# Patient Record
Sex: Female | Born: 1955 | Race: White
Health system: Southern US, Community
[De-identification: ages and names within clinical notes are randomized; demographics above are authoritative.]

## PROBLEM LIST (undated history)

## (undated) DIAGNOSIS — E785 Hyperlipidemia, unspecified: Secondary | ICD-10-CM

## (undated) DIAGNOSIS — F419 Anxiety disorder, unspecified: Secondary | ICD-10-CM

## (undated) DIAGNOSIS — J45909 Unspecified asthma, uncomplicated: Secondary | ICD-10-CM

## (undated) DIAGNOSIS — E039 Hypothyroidism, unspecified: Secondary | ICD-10-CM

## (undated) DIAGNOSIS — K219 Gastro-esophageal reflux disease without esophagitis: Secondary | ICD-10-CM

## (undated) DIAGNOSIS — I10 Essential (primary) hypertension: Secondary | ICD-10-CM

## (undated) HISTORY — PX: CHOLECYSTECTOMY: SHX55

## (undated) HISTORY — PX: DIAGNOSTIC LAPAROSCOPY: SUR761

## (undated) HISTORY — DX: Essential (primary) hypertension: I10

## (undated) HISTORY — DX: Hyperlipidemia, unspecified: E78.5

## (undated) HISTORY — PX: CARDIAC CATHETERIZATION: SHX172

---

## 1997-08-15 ENCOUNTER — Other Ambulatory Visit: Admission: RE | Admit: 1997-08-15 | Discharge: 1997-08-15 | Payer: Self-pay | Admitting: Obstetrics and Gynecology

## 1998-02-01 HISTORY — PX: BREAST SURGERY: SHX581

## 1999-02-04 ENCOUNTER — Encounter: Payer: Self-pay | Admitting: Obstetrics and Gynecology

## 1999-02-04 ENCOUNTER — Encounter: Payer: Self-pay | Admitting: Surgery

## 1999-02-04 ENCOUNTER — Emergency Department (HOSPITAL_COMMUNITY): Admission: EM | Admit: 1999-02-04 | Discharge: 1999-02-05 | Payer: Self-pay | Admitting: Emergency Medicine

## 1999-02-04 ENCOUNTER — Ambulatory Visit (HOSPITAL_COMMUNITY): Admission: RE | Admit: 1999-02-04 | Discharge: 1999-02-04 | Payer: Self-pay | Admitting: Obstetrics and Gynecology

## 1999-11-25 ENCOUNTER — Other Ambulatory Visit: Admission: RE | Admit: 1999-11-25 | Discharge: 1999-11-25 | Payer: Self-pay | Admitting: Obstetrics and Gynecology

## 2002-06-21 ENCOUNTER — Other Ambulatory Visit: Admission: RE | Admit: 2002-06-21 | Discharge: 2002-06-21 | Payer: Self-pay | Admitting: Family Medicine

## 2002-08-01 ENCOUNTER — Ambulatory Visit (HOSPITAL_COMMUNITY): Admission: RE | Admit: 2002-08-01 | Discharge: 2002-08-01 | Payer: Self-pay | Admitting: Internal Medicine

## 2002-08-01 ENCOUNTER — Encounter: Payer: Self-pay | Admitting: Internal Medicine

## 2002-09-13 ENCOUNTER — Ambulatory Visit (HOSPITAL_COMMUNITY): Admission: RE | Admit: 2002-09-13 | Discharge: 2002-09-13 | Payer: Self-pay | Admitting: Neurology

## 2002-09-13 ENCOUNTER — Encounter: Payer: Self-pay | Admitting: Neurology

## 2003-06-27 ENCOUNTER — Other Ambulatory Visit: Admission: RE | Admit: 2003-06-27 | Discharge: 2003-06-27 | Payer: Self-pay | Admitting: Obstetrics and Gynecology

## 2003-07-25 ENCOUNTER — Encounter: Admission: RE | Admit: 2003-07-25 | Discharge: 2003-07-25 | Payer: Self-pay | Admitting: Internal Medicine

## 2004-07-08 ENCOUNTER — Other Ambulatory Visit: Admission: RE | Admit: 2004-07-08 | Discharge: 2004-07-08 | Payer: Self-pay | Admitting: Obstetrics and Gynecology

## 2004-07-30 ENCOUNTER — Encounter: Admission: RE | Admit: 2004-07-30 | Discharge: 2004-07-30 | Payer: Self-pay | Admitting: Obstetrics and Gynecology

## 2005-01-07 ENCOUNTER — Encounter: Admission: RE | Admit: 2005-01-07 | Discharge: 2005-01-07 | Payer: Self-pay | Admitting: Obstetrics and Gynecology

## 2005-08-12 ENCOUNTER — Encounter: Admission: RE | Admit: 2005-08-12 | Discharge: 2005-08-12 | Payer: Self-pay | Admitting: Obstetrics and Gynecology

## 2005-11-15 ENCOUNTER — Encounter: Admission: RE | Admit: 2005-11-15 | Discharge: 2005-11-15 | Payer: Self-pay | Admitting: Neurology

## 2006-08-11 ENCOUNTER — Observation Stay (HOSPITAL_COMMUNITY): Admission: EM | Admit: 2006-08-11 | Discharge: 2006-08-12 | Payer: Self-pay | Admitting: Urology

## 2006-08-11 ENCOUNTER — Other Ambulatory Visit: Payer: Self-pay | Admitting: Urology

## 2006-12-06 ENCOUNTER — Encounter: Admission: RE | Admit: 2006-12-06 | Discharge: 2006-12-06 | Payer: Self-pay | Admitting: Obstetrics and Gynecology

## 2007-01-05 ENCOUNTER — Other Ambulatory Visit: Admission: RE | Admit: 2007-01-05 | Discharge: 2007-01-05 | Payer: Self-pay | Admitting: Obstetrics & Gynecology

## 2007-11-28 ENCOUNTER — Ambulatory Visit: Payer: Self-pay | Admitting: Internal Medicine

## 2008-01-04 ENCOUNTER — Encounter: Admission: RE | Admit: 2008-01-04 | Discharge: 2008-01-04 | Payer: Self-pay | Admitting: Obstetrics & Gynecology

## 2008-01-08 ENCOUNTER — Ambulatory Visit: Payer: Self-pay | Admitting: Internal Medicine

## 2008-01-11 ENCOUNTER — Other Ambulatory Visit: Admission: RE | Admit: 2008-01-11 | Discharge: 2008-01-11 | Payer: Self-pay | Admitting: Obstetrics & Gynecology

## 2008-07-11 ENCOUNTER — Ambulatory Visit: Payer: Self-pay | Admitting: Internal Medicine

## 2008-12-03 ENCOUNTER — Ambulatory Visit: Payer: Self-pay | Admitting: Internal Medicine

## 2009-01-07 ENCOUNTER — Encounter: Admission: RE | Admit: 2009-01-07 | Discharge: 2009-01-07 | Payer: Self-pay | Admitting: Internal Medicine

## 2010-01-27 ENCOUNTER — Ambulatory Visit: Payer: Self-pay | Admitting: Internal Medicine

## 2010-02-03 ENCOUNTER — Encounter
Admission: RE | Admit: 2010-02-03 | Discharge: 2010-02-03 | Payer: Self-pay | Source: Home / Self Care | Attending: Internal Medicine | Admitting: Internal Medicine

## 2010-02-10 ENCOUNTER — Encounter
Admission: RE | Admit: 2010-02-10 | Discharge: 2010-02-10 | Payer: Self-pay | Source: Home / Self Care | Attending: Internal Medicine | Admitting: Internal Medicine

## 2010-03-03 ENCOUNTER — Ambulatory Visit
Admission: RE | Admit: 2010-03-03 | Discharge: 2010-03-03 | Payer: Self-pay | Source: Home / Self Care | Attending: Internal Medicine | Admitting: Internal Medicine

## 2010-03-04 ENCOUNTER — Telehealth (INDEPENDENT_AMBULATORY_CARE_PROVIDER_SITE_OTHER): Payer: Self-pay | Admitting: Radiology

## 2010-03-04 ENCOUNTER — Encounter: Payer: Self-pay | Admitting: Internal Medicine

## 2010-03-04 ENCOUNTER — Ambulatory Visit: Payer: Self-pay | Admitting: Internal Medicine

## 2010-03-04 ENCOUNTER — Ambulatory Visit (INDEPENDENT_AMBULATORY_CARE_PROVIDER_SITE_OTHER): Payer: 59 | Admitting: Internal Medicine

## 2010-03-04 DIAGNOSIS — R079 Chest pain, unspecified: Secondary | ICD-10-CM | POA: Insufficient documentation

## 2010-03-04 DIAGNOSIS — I10 Essential (primary) hypertension: Secondary | ICD-10-CM | POA: Insufficient documentation

## 2010-03-04 DIAGNOSIS — R0789 Other chest pain: Secondary | ICD-10-CM

## 2010-03-04 DIAGNOSIS — E785 Hyperlipidemia, unspecified: Secondary | ICD-10-CM | POA: Insufficient documentation

## 2010-03-05 ENCOUNTER — Encounter: Payer: Self-pay | Admitting: Cardiology

## 2010-03-05 ENCOUNTER — Ambulatory Visit (HOSPITAL_COMMUNITY): Payer: 59 | Attending: Cardiology

## 2010-03-05 DIAGNOSIS — R0789 Other chest pain: Secondary | ICD-10-CM

## 2010-03-05 DIAGNOSIS — R079 Chest pain, unspecified: Secondary | ICD-10-CM | POA: Insufficient documentation

## 2010-03-10 ENCOUNTER — Ambulatory Visit: Payer: Self-pay | Admitting: Cardiology

## 2010-03-10 ENCOUNTER — Encounter: Payer: Self-pay | Admitting: Internal Medicine

## 2010-03-10 ENCOUNTER — Encounter: Payer: Self-pay | Admitting: Cardiology

## 2010-03-10 DIAGNOSIS — R9430 Abnormal result of cardiovascular function study, unspecified: Secondary | ICD-10-CM | POA: Insufficient documentation

## 2010-03-11 NOTE — Assessment & Plan Note (Signed)
Summary: np6: chestpain.dr.baxley call spoke with lela.o.k.to double b...   Vital Signs:  Patient profile:   55 year old female Height:      63 inches Weight:      186 pounds BMI:     33.07 Pulse rate:   77 / minute BP sitting:   122 / 77  (left arm)  Vitals Entered By: Laurance Flatten CMA (March 04, 2010 11:28 AM)  History of Present Illness: Dr. Smith Mince is referred today by Dr. Lenord Fellers for evaluation of chest pressure.  The patient is a pleasant 55 yo woman with a family h/o CAD.  She has HTN and dyslipidemia and is on medical therapy. 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  Her past h/o is notable for pericarditis which was associated with pleuritic chest pain.  She has not had any symptoms of this for many yrs.  The patient has exercise induced asthma and has a difficult time with exertion.  She has been fatigued for the past few months.  3 weeks ago she was carrying out the garbage and developed chest pressure which resolved with rest.  She was awakened the past two nights with chest pressure which resolved spontaneously.  She did not seek medical attention initially.  The patient has chronic dyspnea. The pain radiates to the hands. No diaphoresis or worsening sob.  It is moderate in intensity.   Current Medications (verified): 1)  Pravastatin Sodium 40 Mg Tabs (Pravastatin Sodium) .... Take One Tablet By Mouth Daily At Bedtime 2)  Hyzaar 50-12.5 Mg Tabs (Losartan Potassium-Hctz) .... Once Daily 3)  Aspirin 81 Mg Tbec (Aspirin) .... Take One Tablet By Mouth Daily 4)  Furosemide 40 Mg Tabs (Furosemide) .... As Needed 5)  Advair Diskus 250-50 Mcg/dose Aepb (Fluticasone-Salmeterol) .... Two Times A Day 6)  Singulair 10 Mg Tabs (Montelukast Sodium) .... At Bedtime 7)  Flonase 50 Mcg/act Susp (Fluticasone Propionate) .... Uad 8)  Tavist Allergy 1.34 Mg Tabs (Clemastine Fumarate) .... Uad 9)  Synthroid  75 Mcg Tabs (Levothyroxine Sodium) .... Once Daily 10)  Nexium 40 Mg Cpdr (Esomeprazole Magnesium) .... Once Daily 11)  Topamax 100 Mg Tabs (Topiramate) .... At Bedtime 12)  Vitamin D 2000 Unit Tabs (Cholecalciferol) .... Once Daily 13)  Citracal/vitamin D 250-200 Mg-Unit Tabs (Calcium Citrate-Vitamin D) .... Once Daily 14)  Multivitamins   Tabs (Multiple Vitamin) .... Once Daily 15)  Pristiq 50 Mg Xr24h-Tab (Desvenlafaxine Succinate) .... 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  Past Medical History: HTN Obesity Asthma Dyslipidemia.    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  Review of Systems       All systems reviewed and negative except as noted in the HPI.  Physical Exam  General:  Obese, middle aged, well developed, well nourished, in no acute distress.  HEENT: normal Neck: supple. No JVD. Carotids 2+ bilaterally no bruits Cor: RRR no rubsor  gallops. A soft systolic murmur is present. Lungs: CTA Ab: soft, nontender. nondistended. No HSM. Good bowel sounds Ext: warm. no cyanosis, clubbing or edema Neuro: alert and oriented. Grossly nonfocal. affect pleasant    Impression & Recommendations:  Problem # 1:  CHEST PAIN (ICD-786.50) I  have reviewed the issues with the patient in detail.  She has chest pressure and multiple cardia risk factors and a normal ECG.  Because she cannot walk on the treadmill due to her asthma (her report), I have recommended a Lexiscan myoview. Will schedule this asap. If abnormal, then diagnositc catheterization would be recommended. Her updated medication list for this problem includes:    Aspirin 81 Mg Tbec (Aspirin) .Marland Kitchen... Take one tablet by mouth daily  Orders: Nuclear Stress Test (Nuc Stress  Test)  Problem # 2:  ESSENTIAL HYPERTENSION, BENIGN (ICD-401.1) Her blood pressure is controlled today. She will continue her meds as below. Her updated medication list for this problem includes:    Hyzaar 50-12.5 Mg Tabs (Losartan potassium-hctz) ..... Once daily    Aspirin 81 Mg Tbec (Aspirin) .Marland Kitchen... Take one tablet by mouth daily    Furosemide 40 Mg Tabs (Furosemide) .Marland Kitchen... As needed  Problem # 3:  DYSLIPIDEMIA (ICD-272.4) She will continue her current meds. Her updated medication list for this problem includes:    Pravastatin Sodium 40 Mg Tabs (Pravastatin sodium) .Marland Kitchen... Take one tablet by mouth daily at bedtime  Patient Instructions: 1)  Your physician has requested that you have an lexiscan myoview.  For further information please visit [email or web address]  Please follow instruction sheet, as given.   Orders Added: 1)  Nuclear Stress Test [Nuc Stress Test]

## 2010-03-11 NOTE — Progress Notes (Signed)
Summary: Nuc Pre-Procedure  Phone Note Other Incoming   Reason for Call: Confirm/change Appt Summary of Call: Pt given instructions while here for MD appt.     Nuclear Med Background Indications for Stress Test: Evaluation for Ischemia     Symptoms: Chest Pain, Chest Pressure, Chest Pressure with Exertion, SOB    Nuclear Pre-Procedure Cardiac Risk Factors: Family History - CAD, Hypertension, Lipids Height (in): 63

## 2010-03-11 NOTE — Assessment & Plan Note (Addendum)
Summary: Cardiology Nuclear Testing  Nuclear Med Background Indications for Stress Test: Evaluation for Ischemia    History Comments: No documented CAD  Symptoms: Chest Pain, Chest Pain with Exertion, Chest Pressure, Chest Pressure with Exertion, DOE, Fatigue, Palpitations, Rapid HR  Symptoms Comments: Last episode of JY:NWGNFAOZH   Nuclear Pre-Procedure Cardiac Risk Factors: Family History - CAD, Hypertension, Lipids Caffeine/Decaff Intake: None NPO After: 7:00 PM Lungs: Clear IV 0.9% NS with Angio Cath: 22g     IV Site: R Antecubital IV Started by: Bonnita Levan, RN Chest Size (in) 40     Cup Size C     Height (in): 63 Weight (lb): 187 BMI: 33.25  Nuclear Med Study 1 or 2 day study:  1 day     Stress Test Type:  Treadmill/Lexiscan Reading MD:  Olga Millers, MD     Referring MD:  Lewayne Bunting, MD Resting Radionuclide:  Technetium 10m Tetrofosmin     Resting Radionuclide Dose:  10.0 mCi  Stress Radionuclide:  Technetium 64m Tetrofosmin     Stress Radionuclide Dose:  33.0 mCi   Stress Protocol Exercise Time (min):  2:00 min     Max HR:  122 bpm     Predicted Max HR:  166 bpm  Max Systolic BP: 132 mm Hg     Percent Max HR:  73.49 %Rate Pressure Product:  08657  Lexiscan: 0.4 mg   Stress Test Technologist:  [employer]a [employer], CMA-N     Nuclear Technologist:  Domenic Polite, CNMT  Rest Procedure  Myocardial perfusion imaging was performed at rest 45 minutes following the intravenous administration of Technetium 66m Tetrofosmin.  Stress Procedure  The patient received IV Lexiscan 0.4 mg over 15-seconds with concurrent low level exercise and then Technetium 51m Tetrofosmin was injected at 30-seconds.  There were no significant changes with Lexiscan.  Quantitative spect images were obtained after a 45 minute delay.  QPS Raw Data Images:  Acquisition technically good; normal left ventricular size. Stress Images:  There is decreased uptake in the apex. Rest Images:  Normal  homogeneous uptake in all areas of the myocardium. Subtraction (SDS):  These findings are consistent with mild apical ischemia. Transient Ischemic Dilatation:  1.10  (Normal <1.22)  Lung/Heart Ratio:  0.26  (Normal <0.45)  Quantitative Gated Spect Images QGS EDV:  73 ml QGS ESV:  20 ml QGS EF:  73 % QGS cine images:  Normal wall motion.   Overall Impression  Exercise Capacity: Lexiscan with no exercise. BP Response: Normal blood pressure response. Clinical Symptoms: No chest pain ECG Impression: No significant ST segment change suggestive of ischemia. Overall Impression: Abnormal lexiscan nuclear study with mild apical ischemia.

## 2010-03-13 ENCOUNTER — Ambulatory Visit (HOSPITAL_COMMUNITY): Payer: 59

## 2010-03-13 ENCOUNTER — Ambulatory Visit (HOSPITAL_COMMUNITY)
Admission: RE | Admit: 2010-03-13 | Discharge: 2010-03-13 | Disposition: A | Discharge status: Home / Self Care | Payer: 59 | Source: Ambulatory Visit | Attending: Cardiology | Admitting: Cardiology

## 2010-03-13 DIAGNOSIS — R079 Chest pain, unspecified: Secondary | ICD-10-CM | POA: Insufficient documentation

## 2010-03-13 LAB — CBC
MCHC: 34.3 g/dL (ref 30.0–36.0)
MCV: 91.8 fL (ref 78.0–100.0)
Platelets: 261 10*3/uL (ref 150–400)
RBC: 4.51 MIL/uL (ref 3.87–5.11)
RDW: 14.2 % (ref 11.5–15.5)
WBC: 8 10*3/uL (ref 4.0–10.5)

## 2010-03-13 LAB — BASIC METABOLIC PANEL
CO2: 23 mEq/L (ref 19–32)
GFR calc non Af Amer: >60 mL/min (ref >60)
Potassium: 5.4 mEq/L — ABNORMAL HIGH (ref 3.5–5.1)
Sodium: 135 mEq/L (ref 135–145)

## 2010-03-19 NOTE — Miscellaneous (Signed)
Summary: Orders Update  Clinical Lists Changes  Problems: Added new problem of NONSPECIFIC ABNORMAL UNSPEC CV FUNCTION STUDY (ICD-794.30) Orders: Added new Referral order of Cardiac Catheterization (Cardiac Cath) - Signed

## 2010-03-19 NOTE — Letter (Signed)
Summary: Cardiac Catheterization Instructions- Main Lab  [facility], Main Office  [address]   [place], [place] [address]   Phone: [phone]  Fax: [phone]     03/10/2010 MRN: [id number]  [facility] [address] [place], [place]  [address]  Dear Ms. Cleland,   You are scheduled for Cardiac Catheterization on Friday March 13, 2010 with Dr. Riley Kill.  Please arrive at the [facility] at 8:30       a.m. on the day of your procedure.  1. DIET     _X___ Nothing to eat or drink after midnight except your medications with a sip of water.  2. MAKE SURE YOU TAKE YOUR ASPIRIN.  3. __X__ YOU MAY TAKE ALL of your remaining medications with a small amount of water.      4. Plan for one night stay - bring personal belongings (i.e. toothpaste, toothbrush, etc.)  5. Bring a current list of your medications and current insurance cards.  6. Must have a responsible person to drive you home.   7. Someone must be with you for the first 24 hours after you arrive home.  8. Please wear clothes that are easy to get on and off and wear slip-on shoes.  *Special note: Every effort is made to have your procedure done on time.  Occasionally there are emergencies that present themselves at the hospital that may cause delays.  Please be patient if a delay does occur.  If you have any questions after you get home, please call the office at the number listed above.  Julieta Gutting, RN, BSN

## 2010-04-06 ENCOUNTER — Encounter: Payer: Self-pay | Admitting: Cardiology

## 2010-04-07 ENCOUNTER — Encounter: Payer: Self-pay | Admitting: Cardiology

## 2010-04-07 ENCOUNTER — Ambulatory Visit (INDEPENDENT_AMBULATORY_CARE_PROVIDER_SITE_OTHER): Payer: 59 | Admitting: Cardiology

## 2010-04-07 DIAGNOSIS — R072 Precordial pain: Secondary | ICD-10-CM

## 2010-04-14 NOTE — Miscellaneous (Signed)
  Clinical Lists Changes  Observations: Added new observation of NUCLEAR NOS: Exercise Capacity: Lexiscan with no exercise. BP Response: Normal blood pressure response. Clinical Symptoms: No chest pain ECG Impression: No significant ST segment change suggestive of ischemia. Overall Impression: Abnormal lexiscan nuclear study with mild apical ischemia.  (03/05/2010 14:59)      Nuclear Study  Procedure date:  03/05/2010  Findings:      Exercise Capacity: Lexiscan with no exercise. BP Response: Normal blood pressure response. Clinical Symptoms: No chest pain ECG Impression: No significant ST segment change suggestive of ischemia. Overall Impression: Abnormal lexiscan nuclear study with mild apical ischemia.

## 2010-04-14 NOTE — Assessment & Plan Note (Signed)
Summary: cardiac eval/per pt needs appt to discuss where to go from he...   Visit Type:  Follow-up Primary Provider:  Sharlet Salina, MD  CC:  chest pain.  History of Present Illness: The patient returns for followup of chest discomfort. She recently had a cardiac catheterization which was normal. She had seen Dr. Ladona Ridgel for evaluation of chest discomfort. She thinks this is related to emotional stress. 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 She describes the discomfort as mid and under her left breast. She does notice it if she significantly exerts herself such as carrying 30 pounds. He has still been present since the catheterization. She thinks it might also be related to her history of reactive airway disease.  She does not think it is similar to her previous pericarditis.  Current Medications (verified): 1)  Pravastatin Sodium 40 Mg Tabs (Pravastatin Sodium) .... Take One Tablet By Mouth Daily At Bedtime 2)  Hyzaar 50-12.5 Mg Tabs (Losartan Potassium-Hctz) .... Once Daily 3)  Aspirin 81 Mg Tbec (Aspirin) .... Take One Tablet By Mouth Daily 4)  Furosemide 40 Mg Tabs (Furosemide) .... As Needed 5)  Advair Diskus 250-50 Mcg/dose Aepb (Fluticasone-Salmeterol) .... Two Times A Day 6)  Singulair 10 Mg Tabs (Montelukast Sodium) .... At Bedtime 7)  Flonase 50 Mcg/act Susp (Fluticasone Propionate) .... Uad 8)  Tavist Allergy 1.34 Mg Tabs (Clemastine Fumarate) .... Uad 9)  Synthroid 75 Mcg Tabs (Levothyroxine Sodium) .... Once Daily 10)  Nexium 40 Mg Cpdr (Esomeprazole Magnesium) .... Once Daily 11)  Topamax 100 Mg Tabs (Topiramate) .... At Bedtime 12)  Vitamin D 2000 Unit Tabs (Cholecalciferol) .... Once Daily 13)  Citracal/vitamin D 250-200 Mg-Unit Tabs (Calcium Citrate-Vitamin D) .... Once Daily 14)  Multivitamins   Tabs (Multiple Vitamin) .... Once Daily 15)  Pristiq 50 Mg Xr24h-Tab (Desvenlafaxine Succinate) ....  Once Daily  Allergies (verified): No Known Drug Allergies  Past History:  Past Medical History: HTN Asthma Dyslipidemia.    Review of Systems       As stated in the HPI and negative for all other systems.   Vital Signs:  Patient profile:   55 year old female Height:      63 inches Weight:      187.50 pounds Pulse rate:   72 / minute Pulse rhythm:   regular Resp:     18 per minute BP sitting:   120 / 80  (left arm) Cuff size:   large  Vitals Entered By: Lisabeth Devoid RN (April 07, 2010 3:38 PM)  Physical Exam  General:  Well developed, well nourished, in no acute distress. Head:  normocephalic and atraumatic Neck:  Neck supple, no JVD. No masses, thyromegaly or abnormal cervical nodes. Chest Wall:  no deformities or breast masses noted Lungs:  Clear bilaterally to auscultation and percussion. Heart:  Non-displaced PMI, chest non-tender; regular rate and rhythm, S1, S2 without murmurs, rubs or gallops. Carotid upstroke normal, no bruit. Normal abdominal aortic size, no bruits. Femorals normal pulses, no bruits. Pedals normal pulses. trace edema no varicosities. Abdomen:  Bowel sounds positive; abdomen soft and non-tender without masses, organomegaly, or hernias noted. No hepatosplenomegaly. Msk:  Back normal, normal gait. Muscle strength and tone normal. Extremities:  No clubbing or cyanosis. Neurologic:  Alert and oriented x 3. Skin:  Intact without lesions or rashes. Cervical Nodes:  no significant adenopathy Inguinal Nodes:  no significant adenopathy Psych:  Normal affect.  Impression & Recommendations:  Problem # 1:  CHEST PAIN (ICD-786.50) Her chest pain is atypical. There does not appear to be a cardiac etiology. I do not suspect pericarditis and did not think echocardiography is indicated. Therefore, no further cardiovascular testing is suggested.  Problem # 2:  ESSENTIAL HYPERTENSION, BENIGN (ICD-401.1) Her blood pressure was controlled on meds as listed and  she will continue with this.  Problem # 3:  DYSLIPIDEMIA (ICD-272.4) Her lipids are followed by Dr. Lenord Fellers.  She will continue on pravasatin.  Patient Instructions: 1)  Your physician recommends that you schedule a follow-up appointment as needed 2)  Your physician recommends that you continue on your current medications as directed. Please refer to the Current Medication list given to you today.

## 2010-05-14 NOTE — Procedures (Signed)
  NAMESILVERIA, BOTZ            ACCOUNT NO.:  [id number]  MEDICAL RECORD NO.:  [id number]           PATIENT TYPE:  O  LOCATION:  [facility]                         FACILITY:  [facility]  PHYSICIAN:  Arturo Morton. 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 She was seen in the office as an add-on, and set up for diagnostic heart cath.  PROCEDURE: 1. Left heart catheterization. 2. Selective coronary arteriography. 3. Selective left ventriculography.  DESCRIPTION OF PROCEDURE:  The procedure was performed from the femoral artery using 4-French catheters.  She tolerated the procedure well without complications and was taken to the holding area in satisfactory clinical condition.  HEMODYNAMIC DATA: 1. The central aortic pressure was 133/78, mean 100. 2. LV pressure 127/40. 3. There was no pullback or gradient across the aortic valve.  ANGIOGRAPHIC DATA: 1. The left main coronary artery is free of significant disease. 2. The LAD courses to the apex.  There is a major diagonal and a major     LAD branch.  The LAD and its branches are free of critical disease. 3. There is a small ramus intermedius and a moderate-sized circumflex     marginal system that trifurcates distally.  The circumflex and its     branches are free of critical disease. 4. The right coronary artery has an upward takeoff, supplies an acute     marginal posterior descending and three posterolateral branches,     all of which are relatively small in caliber and free of critical     disease.  CONCLUSION: 1. Well-preserved overall left ventricular systolic function without     wall motion abnormality. 2. No significant high-grade focal obstruction.  DISPOSITION:  The patient will follow up with Dr. Lenord Fellers and Dr. Antoine Poche.  Postcath  instructions have been given to the patient.     Arturo Morton. Riley Kill, MD, [facility]     TDS/MEDQ  D:  04/06/2010  T:  04/07/2010  Job:  [id number]  Electronically Signed by Shawnie Pons MD [facility] on 05/14/2010 05:35:13 AM

## 2010-06-16 NOTE — Op Note (Signed)
NAMEBREYANNA, Donna Reynolds            ACCOUNT NO.:  [id number]   MEDICAL RECORD NO.:  [id number]          PATIENT TYPE:  AMB   LOCATION:  [facility]                         FACILITY:  [facility]   PHYSICIAN:  Sigmund I. 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   OPERATION:  Pelvic floor repair, anterior vaginal vault repair using was  Xenform and Capio device, with [place] pubovaginal sling.   SURGEON:  Sigmund I. Patsi Sears, M.D.   ANESTHESIA:  General LMA.   PREPARATION:  After appropriate preanesthesia, the patient was brought  to the operating room and placed upon the operating table in the dorsal  supine position, where general LMA anesthesia was introduced.  She was  then replaced in the dorsal lithotomy position, where the pubis was  prepped with Betadine solution and draped in the usual fashion.   REVIEW OF HISTORY:  This 55 year old married female has history of  stress urinary incontinence, and difficulty voiding.  She underwent  examination which showed a cystocele, and urodynamics which showed that  the patient had a rather large cystocele.  She was felt to have possible  dyssynergia, and a weak detrusor contraction.  The EMG activity was  normal, however.  She was placed on alpha blockers, but failed this  medication, and continued to complain bitterly of stress urinary  incontinence.  She is now for pelvic floor repair.   PROCEDURE:  With the patient in the dorsal lithotomy position and  general anesthesia, the patient's cystocele became quite prominent, and  was a grade 3.  Plain 0.25% Xylocaine was injected in the midline of the  cystocele, and an 8-cm incision was made, subcutaneous tissue dissected  bilaterally back to the lateral surfaces of the pelvic floor.  Kelly  plication was accomplished using a 3-0 Vicryl suture.  Bleeding was  stopped with suture and electrocoagulation.   Following dissection to the ischial spines bilaterally, and using the  Capio device, 2 sutures on each side were then placed, proximal and  distalward in the ischial spines; and,  following this, a 6 x 8-cm  portion of Xenform was soaked and measured in trapezoid fashion and cut  with a 7-cm base and 4-cm opposite side.  This was then placed through  the previously-placed Capio sutures, with trimming accomplished  distalward.  The Xenform fit perfectly, and the patient had an excellent  cystocele repair and excellent pelvic floor repair.  The patient was  given indigo carmine, the wound was closed with running 2-0 Vicryl  suture.  Cystoscopy was accomplished, which showed blue contrast through  each ureteral orifice.   The bladder was drained of fluid again, and 0.25% plain Xylocaine was  then injected in the mid urethra.  A 1.5-cm incision was then made in  the midline and subcutaneous tissue dissected bilaterally to the level  of the pubovaginal cervical arch.  Two separate suprapubic  incisions  were then made; subcutaneous tissue was dissected.  Using the [place]  pubovaginal sling, both [place] needles were placed.  Repeat cystoscopy  revealed no evidence of any bladder trauma.   The sling was then placed in standard fashion, with great care taken to  avoid making the sling too tight.  With a right-angle clamp behind the  urethra and behind the urethral portion, the wings were cut  subcutaneously.  These wounds were closed with Dermabond.   The wound was then closed with running 2-0 Vicryl suture.  Packing with  Estrace cream was then applied, and the patient was awakened and taken  to the recovery room in good condition.      Sigmund I. Patsi Sears, M.D.  Electronically Signed     SIT/MEDQ  D:   08/11/2006  T:  08/12/2006  Job:  [id number]

## 2010-07-28 ENCOUNTER — Other Ambulatory Visit: Payer: 59 | Admitting: Internal Medicine

## 2010-07-28 ENCOUNTER — Ambulatory Visit (INDEPENDENT_AMBULATORY_CARE_PROVIDER_SITE_OTHER): Payer: 59 | Admitting: Internal Medicine

## 2010-07-28 DIAGNOSIS — IMO0001 Reserved for inherently not codable concepts without codable children: Secondary | ICD-10-CM

## 2010-07-28 DIAGNOSIS — E039 Hypothyroidism, unspecified: Secondary | ICD-10-CM

## 2010-07-29 ENCOUNTER — Encounter: Payer: Self-pay | Admitting: Internal Medicine

## 2010-07-29 NOTE — Progress Notes (Signed)
Pt not seen for office visit. Just had TSH drawn and asked for a total CK because of some generalized musculoskeletal pain. Both of these results were normal. Pt will return for PE in 6 months

## 2010-08-07 ENCOUNTER — Encounter: Payer: Self-pay | Admitting: Cardiology

## 2010-10-06 ENCOUNTER — Telehealth: Payer: Self-pay

## 2010-10-06 MED ORDER — TRAMADOL HCL 50 MG PO TABS: 50.0000 mg | ORAL_TABLET | Freq: Three times a day (TID) | ORAL | Status: AC | PRN

## 2010-10-06 NOTE — Telephone Encounter (Signed)
Patient calls today to request a rx for Tramodol for herself, for back pain. Has tried Tylenol and Ibuprofen without much relief. Rx written and faxed to [facility] for Tramodol 50 mg 1 po tid prn #90/ 2 refills

## 2010-10-06 NOTE — Telephone Encounter (Signed)
Patient calls today requesting a rx for

## 2010-11-17 LAB — I-STAT 8, (EC8 V) (CONVERTED LAB)
Bicarbonate: 22
Glucose, Bld: 88
HCT: 43
Hemoglobin: 14.6
TCO2: 23
pH, Ven: 7.306 — ABNORMAL HIGH

## 2010-12-07 ENCOUNTER — Other Ambulatory Visit: Payer: Self-pay

## 2010-12-07 MED ORDER — CETIRIZINE HCL 10 MG PO TABS: 10.0000 mg | ORAL_TABLET | Freq: Every day | ORAL | Status: DC

## 2011-02-11 ENCOUNTER — Other Ambulatory Visit: Payer: 59 | Admitting: Internal Medicine

## 2011-02-11 DIAGNOSIS — Z0000 Encounter for general adult medical examination without abnormal findings: Secondary | ICD-10-CM

## 2011-02-11 LAB — CBC WITH DIFFERENTIAL/PLATELET
Basophils Relative: 1 % (ref 0–1)
Eosinophils Absolute: 0.3 10*3/uL (ref 0.0–0.7)
Eosinophils Relative: 4 % (ref 0–5)
Hemoglobin: 13.8 g/dL (ref 12.0–15.0)
MCHC: 32.5 g/dL (ref 30.0–36.0)
MCV: 95.9 fL (ref 78.0–100.0)
Neutrophils Relative %: 56 % (ref 43–77)
Platelets: 275 10*3/uL (ref 150–400)
RBC: 4.43 MIL/uL (ref 3.87–5.11)
RDW: 14.4 % (ref 11.5–15.5)
WBC: 8.4 10*3/uL (ref 4.0–10.5)

## 2011-02-11 LAB — COMPREHENSIVE METABOLIC PANEL
AST: 21 U/L (ref 0–37)
BUN: 25 mg/dL — ABNORMAL HIGH (ref 6–23)
Creat: 0.75 mg/dL (ref 0.50–1.10)
Glucose, Bld: 87 mg/dL (ref 70–99)
Potassium: 3.7 mEq/L (ref 3.5–5.3)
Sodium: 138 mEq/L (ref 135–145)
Total Bilirubin: 0.6 mg/dL (ref 0.3–1.2)
Total Protein: 7.2 g/dL (ref 6.0–8.3)

## 2011-02-11 LAB — TSH: TSH: 0.892 u[IU]/mL (ref 0.350–4.500)

## 2011-02-11 LAB — FOLLICLE STIMULATING HORMONE: FSH: 58.7 m[IU]/mL

## 2011-02-11 LAB — LIPID PANEL: VLDL: 13 mg/dL (ref 0–40)

## 2011-02-15 ENCOUNTER — Ambulatory Visit (INDEPENDENT_AMBULATORY_CARE_PROVIDER_SITE_OTHER): Payer: 59 | Admitting: Internal Medicine

## 2011-02-15 ENCOUNTER — Encounter: Payer: Self-pay | Admitting: Internal Medicine

## 2011-02-15 VITALS — BP 120/78 | HR 76 | Ht 62.5 in | Wt 187.0 lb

## 2011-02-15 DIAGNOSIS — E039 Hypothyroidism, unspecified: Secondary | ICD-10-CM

## 2011-02-15 DIAGNOSIS — Z1211 Encounter for screening for malignant neoplasm of colon: Secondary | ICD-10-CM

## 2011-02-15 DIAGNOSIS — I10 Essential (primary) hypertension: Secondary | ICD-10-CM

## 2011-02-15 DIAGNOSIS — E785 Hyperlipidemia, unspecified: Secondary | ICD-10-CM

## 2011-02-15 DIAGNOSIS — K219 Gastro-esophageal reflux disease without esophagitis: Secondary | ICD-10-CM

## 2011-02-15 LAB — POCT URINALYSIS DIPSTICK
Bilirubin, UA: NEGATIVE
Glucose, UA: NEGATIVE
Leukocytes, UA: NEGATIVE
Nitrite, UA: NEGATIVE

## 2011-04-07 ENCOUNTER — Encounter (HOSPITAL_COMMUNITY): Payer: Self-pay | Admitting: Pharmacist

## 2011-04-08 ENCOUNTER — Encounter (HOSPITAL_COMMUNITY)
Admission: RE | Admit: 2011-04-08 | Discharge: 2011-04-08 | Disposition: A | Discharge status: Home / Self Care | Payer: 59 | Source: Ambulatory Visit | Attending: Obstetrics & Gynecology | Admitting: Obstetrics & Gynecology

## 2011-04-08 ENCOUNTER — Encounter (HOSPITAL_COMMUNITY): Payer: Self-pay

## 2011-04-08 HISTORY — DX: Hypothyroidism, unspecified: E03.9

## 2011-04-08 HISTORY — DX: Gastro-esophageal reflux disease without esophagitis: K21.9

## 2011-04-08 HISTORY — DX: Anxiety disorder, unspecified: F41.9

## 2011-04-08 LAB — BASIC METABOLIC PANEL
Calcium: 9.6 mg/dL (ref 8.4–10.5)
GFR calc Af Amer: >90 mL/min (ref >90)
GFR calc non Af Amer: 79 mL/min — ABNORMAL LOW (ref >90)
Potassium: 3.2 mEq/L — ABNORMAL LOW (ref 3.5–5.1)
Sodium: 135 mEq/L (ref 135–145)

## 2011-04-08 LAB — CBC
Hemoglobin: 13.7 g/dL (ref 12.0–15.0)
MCH: 30.6 pg (ref 26.0–34.0)
MCHC: 32.6 g/dL (ref 30.0–36.0)
Platelets: 278 10*3/uL (ref 150–400)
RDW: 14.2 % (ref 11.5–15.5)

## 2011-04-08 NOTE — Patient Instructions (Addendum)
YOUR PROCEDURE IS SCHEDULED ON:04/12/11  ENTER THROUGH THE MAIN ENTRANCE OF [facility] AT:0730 am  USE DESK PHONE AND DIAL [id number] TO INFORM us OF YOUR ARRIVAL  CALL [phone] IF YOU HAVE ANY QUESTIONS OR PROBLEMS PRIOR TO YOUR ARRIVAL.  REMEMBER: DO NOT EAT OR DRINK AFTER MIDNIGHT :Sunday  SPECIAL INSTRUCTIONS:   YOU MAY BRUSH YOUR TEETH THE MORNING OF SURGERY   TAKE THESE MEDICINES THE DAY OF SURGERY WITH SIP OF WATER: am meds   DO NOT WEAR JEWELRY, EYE MAKEUP, LIPSTICK OR DARK FINGERNAIL POLISH DO NOT WEAR LOTIONS DO NOT SHAVE FOR 48 HOURS PRIOR TO SURGERY  YOU WILL NOT BE ALLOWED TO DRIVE YOURSELF HOME.  NAME OF DRIVER: Fayrene Fearing- spouse

## 2011-04-11 MED ORDER — CEFAZOLIN SODIUM-DEXTROSE 2-3 GM-% IV SOLR
2.0000 g | INTRAVENOUS | Status: AC
  Administered 2011-04-12: 2 g via INTRAVENOUS
  Filled 2011-04-11: qty 50

## 2011-04-12 ENCOUNTER — Ambulatory Visit (HOSPITAL_COMMUNITY)
Admission: RE | Admit: 2011-04-12 | Discharge: 2011-04-12 | Disposition: A | Discharge status: Home Health | Payer: 59 | Source: Ambulatory Visit | Attending: Obstetrics & Gynecology | Admitting: Obstetrics & Gynecology

## 2011-04-12 ENCOUNTER — Encounter (HOSPITAL_COMMUNITY): Payer: Self-pay | Admitting: Anesthesiology

## 2011-04-12 ENCOUNTER — Encounter (HOSPITAL_COMMUNITY): Payer: Self-pay | Admitting: *Deleted

## 2011-04-12 ENCOUNTER — Encounter (HOSPITAL_COMMUNITY)
Admission: RE | Disposition: A | Discharge status: Home Health | Payer: Self-pay | Source: Ambulatory Visit | Attending: Obstetrics & Gynecology

## 2011-04-12 ENCOUNTER — Ambulatory Visit (HOSPITAL_COMMUNITY): Payer: 59 | Admitting: Anesthesiology

## 2011-04-12 DIAGNOSIS — K219 Gastro-esophageal reflux disease without esophagitis: Secondary | ICD-10-CM | POA: Insufficient documentation

## 2011-04-12 DIAGNOSIS — I10 Essential (primary) hypertension: Secondary | ICD-10-CM | POA: Insufficient documentation

## 2011-04-12 DIAGNOSIS — N8501 Benign endometrial hyperplasia: Secondary | ICD-10-CM | POA: Diagnosis present

## 2011-04-12 DIAGNOSIS — N950 Postmenopausal bleeding: Secondary | ICD-10-CM | POA: Diagnosis present

## 2011-04-12 HISTORY — PX: DILATION AND CURETTAGE OF UTERUS: SHX78

## 2011-04-12 SURGERY — DILATION AND CURETTAGE
Anesthesia: Monitor Anesthesia Care | Site: Vagina | Wound class: Clean Contaminated

## 2011-04-12 MED ORDER — LIDOCAINE-EPINEPHRINE 1 %-1:100000 IJ SOLN
INTRAMUSCULAR | Status: DC | PRN
  Administered 2011-04-12: 10 mL

## 2011-04-12 MED ORDER — FENTANYL CITRATE 0.05 MG/ML IJ SOLN
INTRAMUSCULAR | Status: AC
  Filled 2011-04-12: qty 2

## 2011-04-12 MED ORDER — PROPOFOL 10 MG/ML IV EMUL
INTRAVENOUS | Status: AC
  Filled 2011-04-12: qty 20

## 2011-04-12 MED ORDER — LACTATED RINGERS IV SOLN
INTRAVENOUS | Status: DC
  Administered 2011-04-12 (×2): via INTRAVENOUS

## 2011-04-12 MED ORDER — ONDANSETRON HCL 4 MG/2ML IJ SOLN
INTRAMUSCULAR | Status: DC | PRN
  Administered 2011-04-12: 4 mg via INTRAVENOUS

## 2011-04-12 MED ORDER — 0.9 % SODIUM CHLORIDE (POUR BTL) OPTIME
TOPICAL | Status: DC | PRN
  Administered 2011-04-12: 1000 mL

## 2011-04-12 MED ORDER — LIDOCAINE HCL (CARDIAC) 20 MG/ML IV SOLN
INTRAVENOUS | Status: DC | PRN
  Administered 2011-04-12: 20 mg via INTRAVENOUS

## 2011-04-12 MED ORDER — HYDROCODONE-ACETAMINOPHEN 5-500 MG PO TABS: 2.0000 | ORAL_TABLET | Freq: Four times a day (QID) | ORAL | Status: DC | PRN

## 2011-04-12 MED ORDER — FENTANYL CITRATE 0.05 MG/ML IJ SOLN: 25.0000 ug | INTRAMUSCULAR | Status: DC | PRN

## 2011-04-12 MED ORDER — ONDANSETRON HCL 4 MG/2ML IJ SOLN
INTRAMUSCULAR | Status: AC
  Filled 2011-04-12: qty 2

## 2011-04-12 MED ORDER — MEPERIDINE HCL 25 MG/ML IJ SOLN: 6.2500 mg | INTRAMUSCULAR | Status: DC | PRN

## 2011-04-12 MED ORDER — PROMETHAZINE HCL 25 MG/ML IJ SOLN: 6.2500 mg | INTRAMUSCULAR | Status: DC | PRN

## 2011-04-12 MED ORDER — PROPOFOL 10 MG/ML IV EMUL
INTRAVENOUS | Status: DC | PRN
  Administered 2011-04-12: 20 mg via INTRAVENOUS
  Administered 2011-04-12: 30 mg via INTRAVENOUS
  Administered 2011-04-12: 20 mg via INTRAVENOUS

## 2011-04-12 MED ORDER — KETOROLAC TROMETHAMINE 30 MG/ML IJ SOLN
INTRAMUSCULAR | Status: DC | PRN
  Administered 2011-04-12: 30 mg via INTRAVENOUS

## 2011-04-12 MED ORDER — LIDOCAINE HCL (CARDIAC) 20 MG/ML IV SOLN
INTRAVENOUS | Status: AC
  Filled 2011-04-12: qty 5

## 2011-04-12 MED ORDER — MIDAZOLAM HCL 5 MG/5ML IJ SOLN
INTRAMUSCULAR | Status: DC | PRN
  Administered 2011-04-12: 2 mg via INTRAVENOUS

## 2011-04-12 MED ORDER — PROPOFOL 10 MG/ML IV EMUL
INTRAVENOUS | Status: DC | PRN
  Administered 2011-04-12: 25 ug/kg/min via INTRAVENOUS

## 2011-04-12 MED ORDER — DEXAMETHASONE SODIUM PHOSPHATE 10 MG/ML IJ SOLN
INTRAMUSCULAR | Status: AC
  Filled 2011-04-12: qty 1

## 2011-04-12 MED ORDER — FENTANYL CITRATE 0.05 MG/ML IJ SOLN
INTRAMUSCULAR | Status: DC | PRN
  Administered 2011-04-12 (×4): 50 ug via INTRAVENOUS

## 2011-04-12 MED ORDER — MIDAZOLAM HCL 2 MG/2ML IJ SOLN
INTRAMUSCULAR | Status: AC
  Filled 2011-04-12: qty 2

## 2011-04-12 MED ORDER — KETOROLAC TROMETHAMINE 30 MG/ML IJ SOLN
INTRAMUSCULAR | Status: AC
  Filled 2011-04-12: qty 1

## 2011-04-12 MED ORDER — KETOROLAC TROMETHAMINE 30 MG/ML IJ SOLN: 15.0000 mg | Freq: Once | INTRAMUSCULAR | Status: DC | PRN

## 2011-04-12 SURGICAL SUPPLY — 15 items
CATH ROBINSON RED A/P 16FR (CATHETERS) ×2 IMPLANT
CLOTH BEACON ORANGE TIMEOUT ST (SAFETY) ×2 IMPLANT
CONTAINER PREFILL 10% NBF 60ML (FORM) ×2 IMPLANT
GLOVE BIOGEL PI IND STRL 7.0 (GLOVE) ×2 IMPLANT
GLOVE BIOGEL PI INDICATOR 7.0 (GLOVE) ×2
GLOVE ECLIPSE 6.5 STRL STRAW (GLOVE) ×4 IMPLANT
GOWN PREVENTION PLUS LG XLONG (DISPOSABLE) ×4 IMPLANT
NDL SPNL 22GX3.5 QUINCKE BK (NEEDLE) ×1 IMPLANT
NEEDLE HYPO 22GX1.5 SAFETY (NEEDLE) ×2 IMPLANT
NEEDLE SPNL 22GX3.5 QUINCKE BK (NEEDLE) ×2 IMPLANT
PACK VAGINAL MINOR WOMEN LF (CUSTOM PROCEDURE TRAY) ×2 IMPLANT
PAD PREP 24X48 CUFFED NSTRL (MISCELLANEOUS) ×1 IMPLANT
SYR CONTROL 10ML LL (SYRINGE) ×2 IMPLANT
TOWEL OR 17X24 6PK STRL BLUE (TOWEL DISPOSABLE) ×4 IMPLANT
WATER STERILE IRR 1000ML POUR (IV SOLUTION) ×2 IMPLANT

## 2011-04-12 NOTE — Op Note (Signed)
04/12/2011  9:25 AM  PATIENT:  Donna Reynolds  56 y.o. female with postmenopausal bleeding and simple endometrial hyperplasia on office biopsy here for D&C.  PRE-OPERATIVE DIAGNOSIS:  PMB/simple hyperplasia  POST-OPERATIVE DIAGNOSIS:  PMB/simple hyperplasia  PROCEDURE:  Procedure(s): DILATATION AND CURETTAGE  SURGEON:  Bebe Moncure,M SUZANNE  ASSISTANTS: OR staff   ANESTHESIA:   IV sedation, Dr. Arby Barrette oversaw the case.  ESTIMATED BLOOD LOSS: * No blood loss amount entered *  BLOOD ADMINISTERED:none   FLUIDS: 1000ccLR  UOP: 50 cc drained with I&O cath  SPECIMEN:  Endometrial curetting  DISPOSITION OF SPECIMEN:  PATHOLOGY  FINDINGS: 2nd degree cystocele and 2nd degree uterine prolapse noted under anesthesia   DESCRIPTION OF OPERATION: Patient was taken to the operating room. She is placed in the supine position.  IV sedation for was initiated. Time out was performed.  Once the patient was comfortable,her legs are positioned in the low lithotomy position in Lyons stirrups. SCDs were on her lower extremities bilaterally and functioning properly.  Patient was then positioned in the high lithotomy position. Betadine prep was performed. The perineum inner thighs and vagina were prepped x3. A red rubber Foley catheter was used to drain the bladder of all urine.  Patient was draped in a normal standard fashion.   Attention was turned the vagina. A bivalve speculum was used to visualize the cervix. The anterior lip of the cervix was grasped with a single-tooth tenaculum.  A paracervical block with 1% lidocaine mixed one-to-one with epinephrine (1:100,000 units) was used. 10 cc total was used.  The uterus sounded to 7 cm. The cervix was then dilated up to #21 dilator. Using a #1 toothed curette, the endometrial cavity was curetted until rough gritty texture is noted in all quadrants. There was endometrial tissue that was obtained in the specimen. This did not appear excessive or abnormal on  gross inspection. At this point the [facility] was completed and the tenaculum was removed from the anterior lip of the cervix. There was minimal bleeding noted from the cervix. All instruments were removed from the vagina.  The Betadine prep was washed off the skin. The legs were positioned back in the low lithotomy position. Sponge, laps, instrument, and needle counts were correct x3. Patient tolerated the procedure very well and was awakened from anesthesia and taken to recovery in stable condition.  COUNTS:  YES  PLAN OF CARE: Transfer to PACU

## 2011-04-12 NOTE — Anesthesia Preprocedure Evaluation (Signed)
Anesthesia Evaluation  Patient identified by MRN, date of birth, ID band Patient awake    Reviewed: Allergy & Precautions, H&P , NPO status , Patient's Chart, lab work & pertinent test results  Airway Mallampati: II TM Distance: >3 FB Neck ROM: full    Dental No notable dental hx. (+) Teeth Intact   Pulmonary neg pulmonary ROS,    Pulmonary exam normal       Cardiovascular hypertension, Pt. on medications     Neuro/Psych PSYCHIATRIC DISORDERS Anxiety negative neurological ROS     GI/Hepatic Neg liver ROS, GERD-  Medicated and Controlled,  Endo/Other  Hypothyroidism   Renal/GU negative Renal ROS     Musculoskeletal negative musculoskeletal ROS (+)   Abdominal Normal abdominal exam  (+)   Peds negative pediatric ROS (+)  Hematology negative hematology ROS (+)   Anesthesia Other Findings   Reproductive/Obstetrics negative OB ROS                           Anesthesia Physical Anesthesia Plan  ASA: II  Anesthesia Plan: MAC   Post-op Pain Management:    Induction: Intravenous  Airway Management Planned:   Additional Equipment:   Intra-op Plan:   Post-operative Plan:   Informed Consent: I have reviewed the patients History and Physical, chart, labs and discussed the procedure including the risks, benefits and alternatives for the proposed anesthesia with the patient or authorized representative who has indicated his/her understanding and acceptance.     Plan Discussed with: CRNA and Surgeon  Anesthesia Plan Comments:         Anesthesia Quick Evaluation

## 2011-04-12 NOTE — Preoperative (Signed)
Beta Blockers   Reason not to administer Beta Blockers:Not Applicable 

## 2011-04-12 NOTE — Discharge Instructions (Addendum)
Post-surgical Instructions, Outpatient Surgery  You may expect to feel dizzy, weak, and drowsy for as long as 24 hours after receiving the medicine that made you sleep (anesthetic). For the first 24 hours after your surgery:    Do not drive a car, ride a bicycle, participate in physical activities, or take public transportation until you are done taking narcotic pain medicines or as directed by Dr. Hyacinth Meeker.   Do not drink alcohol or take tranquilizers.   Do not take medicine that has not been prescribed by your physicians.   Do not sign important papers or make important decisions while on narcotic pain medicines.   Have a responsible person with you.   PAIN MANAGEMENT  Motrin 800mg .  (This is the same as 4-200mg  over the counter tablets of Motrin or ibuprofen.)  You may take this every eight hours or as needed for cramping.    Vicodin 5/500mg .  For more severe pain, take one or two tablets every four to six hours as needed for pain control.  (Remember that narcotic pain medications increase your risk of constipation.  If this becomes a problem, you may take an over the counter stool softener like Colace 100mg  up to four times a day.)  DO'S AND DON'T'S  Do not take a tub bath for one week.  You may shower on the first day after your surgery  Do not do any heavy lifting for one to two weeks.  This increases the chance of bleeding.  Do move around as you feel able.  Stairs are fine.  You may begin to exercise again as you feel able.  Do not lift any weights for two weeks.  Do not put anything in the vagina for two weeks--no tampons, intercourse, or douching.    REGULAR MEDIATIONS/VITAMINS:  You may restart all of your regular medications as prescribed.  You may restart all of your vitamins as you normally take them.    PLEASE CALL OR SEEK MEDICAL CARE IF:  You have persistent nausea and vomiting.   You have trouble eating or drinking.   You have an oral temperature above 100.5.    You have constipation that is not helped by adjusting diet or increasing fluid intake. Pain medicines are a common cause of constipation.   You have heavy vaginal bleeding   DISCHARGE INSTRUCTIONS: D&C / D&E The following instructions have been prepared to help you care for yourself upon your return home.   Personal hygiene: Marland Kitchen Use sanitary pads for vaginal drainage, not tampons. . Shower the day after your procedure. . NO tub baths, pools or Jacuzzis for 2-3 weeks. . Wipe front to back after using the bathroom.  Activity and limitations: . Do NOT drive or operate any equipment for 24 hours. The effects of anesthesia are still present and drowsiness may result. . Do NOT rest in bed all day. . Walking is encouraged. . Walk up and down stairs slowly. . You may resume your normal activity in one to two days or as indicated by your physician.  Sexual activity: NO intercourse for at least 2 weeks after the procedure, or as indicated by your physician.  Diet: Eat a light meal as desired this evening. You may resume your usual diet tomorrow.  Return to work: You may resume your work activities in one to two days or as indicated by your doctor.  What to expect after your surgery: Expect to have vaginal bleeding/discharge for 2-3 days and spotting for up to  10 days. It is not unusual to have soreness for up to 1-2 weeks. You may have a slight burning sensation when you urinate for the first day. Mild cramps may continue for a couple of days. You may have a regular period in 2-6 weeks.  Call your doctor for any of the following: . Excessive vaginal bleeding, saturating and changing one pad every hour. . Inability to urinate 6 hours after discharge from hospital. . Pain not relieved by pain medication. . Fever of 100.4 F or greater. . Unusual vaginal discharge or odor.  Return to office ________________ Call for an appointment ___________________  Patient's signature:  ______________________  Nurse's signature ________________________  Post Anesthesia Care Unit [phone]

## 2011-04-12 NOTE — Anesthesia Postprocedure Evaluation (Signed)
Anesthesia Post Note  Patient: Donna Reynolds  Procedure(s) Performed: Procedure(s) (LRB): DILATATION AND CURETTAGE (N/A)  Anesthesia type: GA  Patient location: PACU  Post pain: Pain level controlled  Post assessment: Post-op Vital signs reviewed  Last Vitals:  Filed Vitals:   04/12/11 1035  BP:   Pulse: 68  Temp:   Resp: 16    Post vital signs: Reviewed  Level of consciousness: sedated  Complications: No apparent anesthesia complications

## 2011-04-12 NOTE — Transfer of Care (Signed)
Immediate Anesthesia Transfer of Care Note  Patient: Donna Reynolds  Procedure(s) Performed: Procedure(s) (LRB): DILATATION AND CURETTAGE (N/A)  Patient Location: PACU  Anesthesia Type: MAC  Level of Consciousness: awake, alert , oriented and patient cooperative  Airway & Oxygen Therapy: Patient Spontanous Breathing and Patient connected to nasal cannula oxygen  Post-op Assessment: Report given to PACU RN and Post -op Vital signs reviewed and stable  Post vital signs: Reviewed and stable  Complications: No apparent anesthesia complications

## 2011-04-12 NOTE — H&P (Signed)
Donna Reynolds is an 56 y.o. female with history of postmenopausal bleeding.  Her FSH was drawn by her PCP and was in the postmenopausal range.  She has some irregular bleeding before her annual exam in February.  I recommended proceeding the day of her exam with an endometrial biopsy.  This result showed simple endometrial hyperplasia but the pathology commented about the scant specimen.  D&C was recommended for complete diagnosis and to help with planning for further treatment.  Risks and benefits have been discussed with the patient.  Pertinent Gynecological History: Menses: post-menopausal Bleeding: post menopausal bleeding Contraception: none DES exposure: denies Blood transfusions: none Sexually transmitted diseases: no past history Previous GYN Procedures: none  Last mammogram: normal Date: 2012 Last pap: normal Date: 2/13 OB History: G4, P1   Menstrual History: Menarche age:43 No LMP recorded.    Past Medical History  Diagnosis Date  . HTN (hypertension)   . Asthma   . Dyslipidemia   . Hypothyroidism   . GERD (gastroesophageal reflux disease)   . Anxiety     Past Surgical History  Procedure Date  . Cardiac catheterization   . Cholecystectomy   . Breast surgery   . Diagnostic laparoscopy     Family History  Problem Relation Age of Onset  . Coronary artery disease Father     Social History:  reports that she has never smoked. She has never used smokeless tobacco. She reports that she drinks alcohol. She reports that she does not use illicit drugs.  Allergies: No Known Allergies  Prescriptions prior to admission  Medication Sig Dispense Refill  . acetaminophen (TYLENOL) 500 MG tablet Take 1,000 mg by mouth every 6 (six) hours as needed. For pain      . calcium carbonate (OS-CAL) 600 MG TABS Take 600 mg by mouth daily.      . cetirizine (ZYRTEC) 10 MG tablet Take 1 tablet (10 mg total) by mouth daily.  300 tablet  0  . Cholecalciferol (VITAMIN D) 2000 UNITS  tablet Take 2,000 Units by mouth daily.        . citalopram (CELEXA) 40 MG tablet Take 40 mg by mouth daily.      . Fluticasone-Salmeterol (ADVAIR DISKUS) 250-50 MCG/DOSE AEPB Inhale 1 puff into the lungs 2 (two) times daily.        Marland Kitchen ibuprofen (ADVIL,MOTRIN) 200 MG tablet Take 400 mg by mouth every 6 (six) hours as needed. For pain      . lansoprazole (PREVACID) 30 MG capsule Take 30 mg by mouth daily.      Marland Kitchen levothyroxine (SYNTHROID, LEVOTHROID) 75 MCG tablet Take 75 mcg by mouth daily.        Marland Kitchen losartan-hydrochlorothiazide (HYZAAR) 50-12.5 MG per tablet Take 1 tablet by mouth daily.        . montelukast (SINGULAIR) 10 MG tablet Take 10 mg by mouth at bedtime.        . pravastatin (PRAVACHOL) 40 MG tablet Take 40 mg by mouth at bedtime.        Marland Kitchen aspirin 81 MG EC tablet Take 81 mg by mouth daily. Stopped taking on 04/07/11 in preparation for surgery.      . furosemide (LASIX) 40 MG tablet Take 40 mg by mouth as needed. For edema         Review of Systems  Constitutional: Negative for fever and chills.  Eyes: Negative for blurred vision.  Respiratory: Negative for cough and hemoptysis.   Cardiovascular: Negative for chest pain and  palpitations.  Gastrointestinal: Negative for heartburn, nausea, vomiting and abdominal pain.  Genitourinary: Negative for dysuria.  Musculoskeletal: Negative for myalgias.  Skin: Negative for rash.  Neurological: Negative for dizziness and headaches.  Endo/Heme/Allergies: Does not bruise/bleed easily.  Psychiatric/Behavioral: Negative for depression.    Blood pressure 128/78, pulse 74, temperature 97.9 F (36.6 C), temperature source Oral, resp. rate 18, SpO2 100.00%. Physical Exam  Vitals reviewed. Constitutional: She is oriented to person, place, and time. She appears well-developed and well-nourished.  HENT:  Head: Normocephalic and atraumatic.  Neck: Normal range of motion.  Cardiovascular: Normal rate and regular rhythm.   Respiratory: Effort normal  and breath sounds normal.  Neurological: She is alert and oriented to person, place, and time.  Skin: Skin is warm and dry.  Psychiatric: She has a normal mood and affect.    No results found for this or any previous visit (from the past 24 hour(s)).  No results found.  Assessment/Plan: 56 year old MWF with simple endometrial hyperplasia on office biopsy her for [facility] for more definitive diagnosis.  Further treatment will be planned pending results.  All questions answered and patient is here ready to proceed.  Donna Reynolds,M SUZANNE 04/12/2011, 8:27 AM

## 2011-04-12 NOTE — Anesthesia Postprocedure Evaluation (Signed)
Anesthesia Post Note  Patient: Donna Reynolds  Procedure(s) Performed: Procedure(s) (LRB): DILATATION AND CURETTAGE (N/A)  Anesthesia type: MAC  Patient location: PACU  Post pain: Pain level controlled  Post assessment: Post-op Vital signs reviewed  Last Vitals:  Filed Vitals:   04/12/11 0738  BP: 128/78  Pulse: 74  Temp: 36.6 C  Resp: 18    Post vital signs: Reviewed  Level of consciousness: sedated  Complications: No apparent anesthesia complications

## 2011-04-13 ENCOUNTER — Encounter (HOSPITAL_COMMUNITY): Payer: Self-pay | Admitting: Obstetrics & Gynecology

## 2011-04-19 HISTORY — PX: INTRAUTERINE DEVICE INSERTION: SHX323

## 2011-04-20 ENCOUNTER — Encounter: Payer: Self-pay | Admitting: Internal Medicine

## 2011-04-22 ENCOUNTER — Encounter: Payer: Self-pay | Admitting: Internal Medicine

## 2011-04-30 ENCOUNTER — Encounter: Payer: Self-pay | Admitting: Internal Medicine

## 2011-04-30 DIAGNOSIS — J45909 Unspecified asthma, uncomplicated: Secondary | ICD-10-CM | POA: Insufficient documentation

## 2011-04-30 DIAGNOSIS — K219 Gastro-esophageal reflux disease without esophagitis: Secondary | ICD-10-CM | POA: Insufficient documentation

## 2011-04-30 DIAGNOSIS — E039 Hypothyroidism, unspecified: Secondary | ICD-10-CM | POA: Insufficient documentation

## 2011-04-30 DIAGNOSIS — J309 Allergic rhinitis, unspecified: Secondary | ICD-10-CM | POA: Insufficient documentation

## 2011-04-30 NOTE — Progress Notes (Signed)
Subjective:    Patient ID: Donna Reynolds, female    DOB: [birth year], 56 y.o.   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 History of allergic rhinitis , asthma, GE reflux, hyperlipidemia, hypertension, hypothyroidism.patient was allergy tested by Dr.  Callas 2010 and was found to have positive skin test to house dust, dust mites, cockroach, mold mix, slight reaction to grass. Not positive to trees dog cat or feathers. Selective food testing was negative. Spirometry revealed a normal study. In 2011 patient had some sciatica in her right leg for which tramadol was prescribed. Occasionally has some dependent edema and takes Lasix 40 mg on a when necessary basis. GYN physician is Dr. Leda Quail. Patient had cardiology workup March 2012 for chest pain which was unremarkable. Had endoscopy and colonoscopy by Dr. Loreta Ave 2009. There was an isolated diverticulum in the left colon otherwise normal colonoscopy. History of shingles 2006. History of left lower lobe pneumonia August 2006. Had Pneumovax 1998. History of diverticulitis 2001. Gets annual influenza immunization. Positive measles titer 1989. Laparoscopic cholecystectomy March 1996. Hospitalized with chickenpox in the 1980's. History of mononucleosis in the 1980's. Had endoscopy at the time of colonoscopy in 2009 because of melanotic stools. Had been taking a lot of ibuprofen for dental problems. Endoscopy showed some gastric polyps. History of migraine headaches. 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 Has been on Topamax in the past per Dr. Anne Hahn. 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 Has had benign breast biopsy in the past.  Social history: She is married to a Teacher, early years/pre. One adult daughter who is married and lives in [place]. [place] a native of [place] [address] and [employer] for medical school. 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 Nonsmoker.  Family history: Mother with history of hypertension and hyperlipidemia. Father is deceased. He had a history of congestive heart failure, chronic kidney disease, and noninvasive bladder cancer.    Review of Systems  Constitutional: Negative.   HENT: Positive for rhinorrhea.   Eyes: Negative.   Respiratory: Negative.   Cardiovascular: Negative.   Gastrointestinal:       History of GE reflux  Genitourinary: Negative for dysuria.  Musculoskeletal: Negative.   Neurological: Negative.   Hematological: Negative.   Psychiatric/Behavioral: Positive for dysphoric mood.       Objective:   Physical Exam  Vitals reviewed. Constitutional: She is oriented to person, place, and time. She appears well-developed and well-nourished. No distress.  HENT:  Head: Normocephalic and atraumatic.  Right Ear: External ear normal.  Left Ear: External ear normal.  Mouth/Throat: Oropharynx is clear and moist. No oropharyngeal exudate.  Eyes: Conjunctivae and EOM are normal. Pupils are equal, round, and reactive to light. Right eye exhibits no discharge. Left eye exhibits no discharge. No scleral icterus.  Neck: Neck supple. No JVD present. No thyromegaly present.  Cardiovascular: Normal rate, regular rhythm, normal heart sounds and intact distal pulses.   No murmur heard. Pulmonary/Chest: Effort normal and breath sounds normal. No respiratory distress. She has no wheezes. She has no rales.       Breasts normal female  Abdominal: Soft. Bowel sounds are normal. She exhibits no distension and no mass. There is no tenderness. There is no rebound and no guarding.  Genitourinary:       Deferred to GYN physician  Lymphadenopathy:    She has  no cervical adenopathy.  Neurological: She is alert and oriented to person, place, and time. She has normal reflexes. She displays normal reflexes. No cranial nerve deficit.  Coordination normal.  Skin: Skin is warm and dry. No rash noted. She is not diaphoretic.  Psychiatric: She has a normal mood and affect. Her behavior is normal. Judgment and thought content normal.          Assessment & Plan:  Hypertension  Hyperlipidemia  History of asthma  History of allergic rhinitis  History of GE reflux  History of hypothyroidism  History of fibrocystic breast disease  History of migraine headaches  Plan: Return in 6 months& continue same medications.

## 2011-04-30 NOTE — Patient Instructions (Signed)
Continue same medications. Return in 6 months 

## 2011-08-19 ENCOUNTER — Ambulatory Visit (INDEPENDENT_AMBULATORY_CARE_PROVIDER_SITE_OTHER): Payer: 59 | Admitting: Internal Medicine

## 2011-08-19 ENCOUNTER — Encounter: Payer: Self-pay | Admitting: Internal Medicine

## 2011-08-19 VITALS — BP 118/74 | HR 80 | Temp 97.8°F | Ht 62.5 in | Wt 195.5 lb

## 2011-08-19 DIAGNOSIS — I10 Essential (primary) hypertension: Secondary | ICD-10-CM

## 2011-08-19 DIAGNOSIS — E785 Hyperlipidemia, unspecified: Secondary | ICD-10-CM

## 2011-08-19 DIAGNOSIS — E039 Hypothyroidism, unspecified: Secondary | ICD-10-CM

## 2011-08-19 LAB — COMPREHENSIVE METABOLIC PANEL
AST: 14 U/L (ref 0–37)
Alkaline Phosphatase: 67 U/L (ref 39–117)
BUN: 17 mg/dL (ref 6–23)
Creat: 0.85 mg/dL (ref 0.50–1.10)
Total Bilirubin: 0.6 mg/dL (ref 0.3–1.2)

## 2011-08-19 LAB — LIPID PANEL
Cholesterol: 123 mg/dL (ref 0–200)
Total CHOL/HDL Ratio: 2.1 Ratio
Triglycerides: 51 mg/dL (ref <150)
VLDL: 10 mg/dL (ref 0–40)

## 2011-08-19 NOTE — Patient Instructions (Addendum)
Return in 6 months for PE. We will review labs and make further recommendations on your meds.

## 2011-08-19 NOTE — Progress Notes (Signed)
  Subjective:    Patient ID: Donna Reynolds, female    DOB: 1955/06/30, 56 y.o.   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 Will be moving back to former home mid August. Has had some difficulty finding full time work. May look at doing some volunteer work for [employer]	[place]. 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 Has gained some weight. Needs Advair for SOB at times. Samples given 250/50. She thinks maybe Synthroid needs to be adjusted.    Review of Systems     Objective:   Physical Exam Chest clear; Cor RRR; No thyromegaly; Ext without edema.        Assessment & Plan:  HTN Hypothyroidism Hyperlipidemia  Plan:  RTC in 6 months for PE. Fasting labs done today including TSH, C-met; lipid panel.  Samples of Advair and Pristiq given.

## 2011-10-06 ENCOUNTER — Encounter: Payer: Self-pay | Admitting: Internal Medicine

## 2011-10-06 ENCOUNTER — Other Ambulatory Visit: Payer: Self-pay | Admitting: Internal Medicine

## 2011-10-06 ENCOUNTER — Ambulatory Visit
Admission: RE | Admit: 2011-10-06 | Discharge: 2011-10-06 | Disposition: A | Discharge status: Home / Self Care | Payer: 59 | Source: Ambulatory Visit | Attending: Internal Medicine | Admitting: Internal Medicine

## 2011-10-06 ENCOUNTER — Ambulatory Visit (INDEPENDENT_AMBULATORY_CARE_PROVIDER_SITE_OTHER): Payer: 59 | Admitting: Internal Medicine

## 2011-10-06 VITALS — BP 120/82 | HR 80 | Temp 99.2°F | Resp 12

## 2011-10-06 DIAGNOSIS — B349 Viral infection, unspecified: Secondary | ICD-10-CM

## 2011-10-06 DIAGNOSIS — R509 Fever, unspecified: Secondary | ICD-10-CM

## 2011-10-06 DIAGNOSIS — B9789 Other viral agents as the cause of diseases classified elsewhere: Secondary | ICD-10-CM

## 2011-10-06 LAB — CBC WITH DIFFERENTIAL/PLATELET
Eosinophils Absolute: 0.3 10*3/uL (ref 0.0–0.7)
Eosinophils Relative: 3 % (ref 0–5)
Hemoglobin: 12.6 g/dL (ref 12.0–15.0)
Lymphs Abs: 2.1 10*3/uL (ref 0.7–4.0)
MCH: 29.6 pg (ref 26.0–34.0)
MCV: 90.4 fL (ref 78.0–100.0)
Monocytes Relative: 10 % (ref 3–12)
Neutrophils Relative %: 62 % (ref 43–77)
RBC: 4.25 MIL/uL (ref 3.87–5.11)

## 2011-10-06 NOTE — Patient Instructions (Addendum)
Take Vicodin sparing for myalgias. Call in am with progress report.

## 2011-10-06 NOTE — Progress Notes (Signed)
  Subjective:    Patient ID: Donna Reynolds, female    DOB: 26-Jun-1955, 56 y.o.   MRN: [id number]  HPI Onset last night of fever and myalgias. Has had pain in  back bilaterally which has persisted across mid-thoracic area.  Temperature has been a 102. Patient has felt nauseated but has not vomited. She also has a headache. No sore throat. No cough. Noticed some blood with urination recently but has history of intermittent vaginal spotting s/p  D and C. . Last week had some urinary urgency but no dysuria. No URI symptoms. No known tick exposure. Has been busy moving recently and has lifted a lot of heavy boxes. However says this back pain is different from any previous musculoskeletal back pain she has had. [place]. No other significant travel history.   Review of Systems     Objective:   Physical Exam Bilateral CVA tenderness more prominent on right than left. Skin is warm and dry, flushed without rashes.  Nodes none. Pharynx is clear. Has large tonsils but they are not inflamed. TMs are clear. Chest clear. Cardiac exam regular rate and rhythm. Genitalia: NFEG. Dark blood coming from cervical os. U/A contains large blood obtained by clean catch not cath specimen.        Assessment & Plan:  Fever  Myalgias  Probable viral syndrome CXR is negative. WBC 9,000. Urine culture sent. Plan:   Vicodin 5/500 #60 one every 6-8 hours prn pain with one refill. Call tomorrow with progress report. Consider RMSF titer.

## 2011-10-07 ENCOUNTER — Telehealth: Payer: Self-pay | Admitting: Internal Medicine

## 2011-10-07 LAB — POCT URINALYSIS DIPSTICK
Glucose, UA: NEGATIVE
Leukocytes, UA: NEGATIVE
Spec Grav, UA: 1.01
Urobilinogen, UA: NEGATIVE

## 2011-10-07 NOTE — Telephone Encounter (Signed)
Called to see how patient was doing after acute visit yesterday. Says temperature is down to 99 from 102. Still has myalgias, malaise and fatigue. Patient is no worse and is pleased that fever is down. She is not on any antibiotics. Continue to observe. She will call if symptoms worsen. Urine culture is pending.

## 2011-10-08 LAB — URINE CULTURE: Colony Count: 2000

## 2011-10-12 ENCOUNTER — Ambulatory Visit (INDEPENDENT_AMBULATORY_CARE_PROVIDER_SITE_OTHER): Payer: 59 | Admitting: Internal Medicine

## 2011-10-12 DIAGNOSIS — R509 Fever, unspecified: Secondary | ICD-10-CM

## 2011-10-12 LAB — COMPREHENSIVE METABOLIC PANEL
BUN: 20 mg/dL (ref 6–23)
CO2: 29 mEq/L (ref 19–32)
Calcium: 9.1 mg/dL (ref 8.4–10.5)
Creat: 0.9 mg/dL (ref 0.50–1.10)
Glucose, Bld: 87 mg/dL (ref 70–99)
Total Bilirubin: 0.6 mg/dL (ref 0.3–1.2)

## 2011-10-12 LAB — CBC WITH DIFFERENTIAL/PLATELET
Lymphocytes Relative: 32 % (ref 12–46)
Lymphs Abs: 2.7 10*3/uL (ref 0.7–4.0)
MCV: 90.4 fL (ref 78.0–100.0)
Neutro Abs: 5.1 10*3/uL (ref 1.7–7.7)
Neutrophils Relative %: 60 % (ref 43–77)
Platelets: 294 10*3/uL (ref 150–400)
RBC: 4.4 MIL/uL (ref 3.87–5.11)
WBC: 8.5 10*3/uL (ref 4.0–10.5)

## 2011-10-12 LAB — SEDIMENTATION RATE: Sed Rate: 39 mm/hr — ABNORMAL HIGH (ref 0–22)

## 2011-10-13 LAB — CMV IGM: CMV IgM: 0.01 (ref <0.90)

## 2011-10-13 LAB — ROCKY MTN SPOTTED FVR ABS PNL(IGG+IGM): RMSF IgM: 0.13 IV

## 2011-10-13 LAB — ANA: Anti Nuclear Antibody(ANA): NEGATIVE

## 2011-11-01 ENCOUNTER — Encounter: Payer: Self-pay | Admitting: Internal Medicine

## 2011-11-01 NOTE — Progress Notes (Signed)
  Subjective:    Patient ID: Donna Reynolds, female    DOB: [birth year], 56 y.o.   MRN: [id number]  HPI 56 year old female seen recently for fever, myalgias, malaise and fatigue. CBC was within normal limits. Urine culture was negative. Chest x-ray was normal. Patient was percent to have had viral syndrome. Still not feeling well. Continues with extreme malaise and fatigue and low-grade fever. Patient is concerned about possible tickborne illness, et Karie Soda. Appetite is decreased. Looks fatigued.    Review of Systems     Objective:   Physical Exam HEENT exam: TMs and pharynx are clear. Chest is clear. Cardiac exam regular rate and rhythm normal S1 and S2. Extremities without edema. No inflamed joints.        Assessment & Plan:  Prolonged febrile illness with malaise and fatigue  Plan: Additional lab work will be obtained today including CMV titer, sedimentation rate, Lyme titer, RMSF, CBC and comprehensive metabolic panel.  Addendum: Sedimentation rate is 39, other lab work is within normal limits or negative

## 2011-11-01 NOTE — Patient Instructions (Addendum)
Await results of lab work. Continue hydrocodone for myalgias.

## 2012-02-17 ENCOUNTER — Other Ambulatory Visit: Payer: 59 | Admitting: Internal Medicine

## 2012-02-18 ENCOUNTER — Encounter: Payer: 59 | Admitting: Internal Medicine

## 2012-03-13 ENCOUNTER — Encounter: Payer: Self-pay | Admitting: Obstetrics & Gynecology

## 2012-03-16 ENCOUNTER — Encounter: Payer: 59 | Admitting: Internal Medicine

## 2012-04-26 ENCOUNTER — Encounter: Payer: Self-pay | Admitting: Obstetrics & Gynecology

## 2012-04-26 ENCOUNTER — Ambulatory Visit (INDEPENDENT_AMBULATORY_CARE_PROVIDER_SITE_OTHER): Payer: No Typology Code available for payment source | Admitting: Obstetrics & Gynecology

## 2012-04-26 VITALS — BP 126/62 | Ht 62.5 in | Wt 194.0 lb

## 2012-04-26 DIAGNOSIS — Z01419 Encounter for gynecological examination (general) (routine) without abnormal findings: Secondary | ICD-10-CM

## 2012-04-26 NOTE — Progress Notes (Signed)
57 y.o. [id number] MarriedCaucasianF here for annual exam.  Reports no bleeding since August.  Doing really well.  Reports some urgency and if her bladder if really full, she will have a small amt of incontinence.  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    Patient's last menstrual period was 09/02/2011.          Sexually active: yes  The current method of family planning is IUD and post menopausal status.    Exercising: yes  walking 3-4 times weekly. Smoker:  no  Health Maintenance: Pap:  03/31/11 WNL/negative HR HPV  MMG:  04/20/11, 04/22/11 additional views-diag mmg scheduled 4/14 Colonoscopy:  2009 BMD:   2010 lowest T score -0.9 TDaP:  2008   reports that she has never smoked. She has never used smokeless tobacco. She reports that  drinks alcohol. She reports that she does not use illicit drugs.  Past Medical History  Diagnosis Date  . HTN (hypertension)   . Asthma   . Dyslipidemia   . Hypothyroidism   . GERD (gastroesophageal reflux disease)   . Anxiety     Past Surgical History  Procedure Laterality Date  . Cardiac catheterization    . Cholecystectomy    . Breast surgery    . Diagnostic laparoscopy    . Dilation and curettage of uterus  04/12/2011    Procedure: DILATATION AND CURETTAGE;  Surgeon: Jerene Bears, MD;  Location: [facility];  Service: Gynecology;  Laterality: N/A;    Current Outpatient Prescriptions  Medication Sig Dispense Refill  . acetaminophen (TYLENOL) 500 MG tablet Take 1,000 mg by mouth every 6 (six) hours as needed. For pain      . aspirin 81 MG EC tablet Take 81 mg by mouth daily. Stopped taking on 04/07/11 in preparation for surgery.      . calcium carbonate (OS-CAL) 600 MG TABS Take 600 mg by mouth daily.      . cholecalciferol (VITAMIN D) 1000 UNITS tablet Take 1,000 Units by mouth daily.      . Fluticasone-Salmeterol (ADVAIR DISKUS) 250-50 MCG/DOSE AEPB Inhale 1 puff into the lungs 2  (two) times daily.        Marland Kitchen ibuprofen (ADVIL,MOTRIN) 200 MG tablet Take 400 mg by mouth every 6 (six) hours as needed. For pain      . lansoprazole (PREVACID) 30 MG capsule Take 30 mg by mouth daily.      Marland Kitchen levothyroxine (SYNTHROID, LEVOTHROID) 88 MCG tablet Take 88 mcg by mouth daily.      Marland Kitchen losartan-hydrochlorothiazide (HYZAAR) 50-12.5 MG per tablet Take 1 tablet by mouth daily.        . Magnesium 400 MG CAPS Take by mouth.      . montelukast (SINGULAIR) 10 MG tablet Take 10 mg by mouth at bedtime.        . Multiple Vitamins-Minerals (MULTIVITAMIN PO) Take by mouth.      . pravastatin (PRAVACHOL) 40 MG tablet Take 40 mg by mouth at bedtime.        . cetirizine (ZYRTEC) 10 MG tablet Take 1 tablet (10 mg total) by mouth daily.  300 tablet  0   No current facility-administered medications for this visit.    Family History  Problem Relation Age of Onset  . Coronary artery disease Father     ROS:  Pertinent items are noted in HPI.  Otherwise, a comprehensive ROS was negative.  Exam:   BP  126/62  Ht 5' 2.5" (1.588 m)  Wt 194 lb (87.998 kg)  BMI 34.9 kg/m2  LMP 09/02/2011 Height:   Height: 5' 2.5" (158.8 cm)  Ht Readings from Last 3 Encounters:  04/26/12 5' 2.5" (1.588 m)  08/19/11 5' 2.5" (1.588 m)  04/08/11 5' 2.5" (1.588 m)    General appearance: alert, cooperative and appears stated age Head: Normocephalic, without obvious abnormality, atraumatic Neck: no adenopathy, supple, symmetrical, trachea midline and thyroid not enlarged, symmetric, no tenderness/mass/nodules Lungs: clear to auscultation bilaterally Breasts: Inspection negative, No nipple retraction or dimpling, No nipple discharge or bleeding, No axillary or supraclavicular adenopathy, Normal to palpation without dominant masses Heart: regular rate and rhythm Abdomen: soft, non-tender; bowel sounds normal; no masses,  no organomegaly Extremities: extremities normal, atraumatic, no cyanosis or edema Skin: Skin color,  texture, turgor normal. No rashes or lesions Lymph nodes: Cervical, supraclavicular, and axillary nodes normal. No abnormal inguinal nodes palpated Neurologic: Grossly normal   Pelvic: External genitalia:  no lesions              Urethra:  normal appearing urethra with no masses, tenderness or lesions              Bartholins and Skenes: normal                 Vagina: normal appearing vagina with normal color and discharge, no lesions              Cervix: no lesions              Pap taken: no Bimanual Exam:  Uterus:  normal size, contour, position, consistency, mobility, non-tender              Adnexa: no mass, fullness, tenderness               Rectovaginal: Confirms               Anus:  normal sphincter tone, no lesions  A:  Well Woman with normal exam, history of simple endometrial hyperplasia s/p Mirena IUD use.  P:   Labs with Dr. Lenord Fellers Continue IUD use.  No biopsy today. Mammogram return annually or prn  An After Visit Summary was printed and given to the patient.

## 2012-04-26 NOTE — Patient Instructions (Signed)

## 2012-05-02 ENCOUNTER — Encounter: Payer: 59 | Admitting: Internal Medicine

## 2012-05-09 ENCOUNTER — Telehealth: Payer: Self-pay | Admitting: Internal Medicine

## 2012-05-09 ENCOUNTER — Other Ambulatory Visit: Payer: Self-pay

## 2012-05-09 MED ORDER — MONTELUKAST SODIUM 10 MG PO TABS: 10.0000 mg | ORAL_TABLET | Freq: Every day | ORAL | Status: DC

## 2012-05-09 NOTE — Telephone Encounter (Signed)
Please refill for one year to new pharmacy

## 2012-06-19 ENCOUNTER — Telehealth: Payer: Self-pay | Admitting: Internal Medicine

## 2012-06-19 NOTE — Telephone Encounter (Signed)
Please refill these meds for one year.

## 2012-06-20 ENCOUNTER — Other Ambulatory Visit: Payer: Self-pay

## 2012-06-20 MED ORDER — CITALOPRAM HYDROBROMIDE 20 MG PO TABS: 20.0000 mg | ORAL_TABLET | Freq: Every day | ORAL | Status: DC

## 2012-06-20 MED ORDER — LOSARTAN POTASSIUM-HCTZ 50-12.5 MG PO TABS: 1.0000 | ORAL_TABLET | Freq: Every day | ORAL | Status: DC

## 2012-07-04 ENCOUNTER — Other Ambulatory Visit: Payer: No Typology Code available for payment source | Admitting: Internal Medicine

## 2012-07-06 ENCOUNTER — Encounter: Payer: No Typology Code available for payment source | Admitting: Internal Medicine

## 2012-08-07 ENCOUNTER — Other Ambulatory Visit: Payer: No Typology Code available for payment source | Admitting: Internal Medicine

## 2012-08-07 ENCOUNTER — Telehealth: Payer: Self-pay | Admitting: Internal Medicine

## 2012-08-07 MED ORDER — CITALOPRAM HYDROBROMIDE 20 MG PO TABS: 20.0000 mg | ORAL_TABLET | Freq: Every day | ORAL | Status: DC

## 2012-08-07 MED ORDER — PRAVASTATIN SODIUM 40 MG PO TABS: 40.0000 mg | ORAL_TABLET | Freq: Every day | ORAL | Status: DC

## 2012-08-08 ENCOUNTER — Other Ambulatory Visit: Payer: No Typology Code available for payment source | Admitting: Internal Medicine

## 2012-08-08 DIAGNOSIS — E039 Hypothyroidism, unspecified: Secondary | ICD-10-CM

## 2012-08-08 DIAGNOSIS — Z13220 Encounter for screening for lipoid disorders: Secondary | ICD-10-CM

## 2012-08-08 DIAGNOSIS — Z130 Encounter for screening for diseases of the blood and blood-forming organs and certain disorders involving the immune mechanism: Secondary | ICD-10-CM

## 2012-08-08 DIAGNOSIS — I10 Essential (primary) hypertension: Secondary | ICD-10-CM

## 2012-08-08 LAB — CBC WITH DIFFERENTIAL/PLATELET
Basophils Relative: 1 % (ref 0–1)
Eosinophils Absolute: 0.3 10*3/uL (ref 0.0–0.7)
Eosinophils Relative: 4 % (ref 0–5)
MCH: 29 pg (ref 26.0–34.0)
MCHC: 33.7 g/dL (ref 30.0–36.0)
MCV: 86.1 fL (ref 78.0–100.0)
Monocytes Relative: 8 % (ref 3–12)
Neutrophils Relative %: 55 % (ref 43–77)
Platelets: 286 10*3/uL (ref 150–400)

## 2012-08-08 LAB — LIPID PANEL
Cholesterol: 129 mg/dL (ref 0–200)
LDL Cholesterol: 62 mg/dL (ref 0–99)
Total CHOL/HDL Ratio: 2.2 Ratio
Triglycerides: 47 mg/dL (ref <150)
VLDL: 9 mg/dL (ref 0–40)

## 2012-08-08 LAB — COMPREHENSIVE METABOLIC PANEL
Alkaline Phosphatase: 83 U/L (ref 39–117)
CO2: 29 mEq/L (ref 19–32)
Creat: 0.9 mg/dL (ref 0.50–1.10)
Glucose, Bld: 97 mg/dL (ref 70–99)
Sodium: 137 mEq/L (ref 135–145)
Total Bilirubin: 0.7 mg/dL (ref 0.3–1.2)
Total Protein: 6.9 g/dL (ref 6.0–8.3)

## 2012-08-08 LAB — TSH: TSH: 0.968 u[IU]/mL (ref 0.350–4.500)

## 2012-08-14 ENCOUNTER — Encounter: Payer: Self-pay | Admitting: Internal Medicine

## 2012-08-14 ENCOUNTER — Ambulatory Visit (INDEPENDENT_AMBULATORY_CARE_PROVIDER_SITE_OTHER): Payer: No Typology Code available for payment source | Admitting: Internal Medicine

## 2012-08-14 VITALS — BP 110/84 | HR 80 | Temp 99.4°F | Ht 62.75 in | Wt 192.5 lb

## 2012-08-14 DIAGNOSIS — Z0000 Encounter for general adult medical examination without abnormal findings: Secondary | ICD-10-CM

## 2012-08-14 LAB — POCT URINALYSIS DIPSTICK
Leukocytes, UA: NEGATIVE
Nitrite, UA: NEGATIVE
Protein, UA: NEGATIVE
Urobilinogen, UA: NEGATIVE

## 2012-08-14 NOTE — Patient Instructions (Addendum)
Continue same meds  And return in 6 months

## 2012-09-08 ENCOUNTER — Other Ambulatory Visit: Payer: Self-pay

## 2012-09-08 MED ORDER — LEVOTHYROXINE SODIUM 75 MCG PO TABS: 75.0000 ug | ORAL_TABLET | Freq: Every day | ORAL | Status: DC

## 2012-10-06 ENCOUNTER — Other Ambulatory Visit: Payer: Self-pay

## 2012-10-06 MED ORDER — FLUTICASONE-SALMETEROL 250-50 MCG/DOSE IN AEPB: 1.0000 | INHALATION_SPRAY | Freq: Two times a day (BID) | RESPIRATORY_TRACT | Status: DC

## 2012-10-27 ENCOUNTER — Encounter: Payer: Self-pay | Admitting: Obstetrics & Gynecology

## 2012-11-24 ENCOUNTER — Telehealth: Payer: Self-pay | Admitting: Internal Medicine

## 2012-11-24 MED ORDER — LEVOFLOXACIN 500 MG PO TABS: 500.0000 mg | ORAL_TABLET | Freq: Every day | ORAL | Status: DC

## 2012-11-24 NOTE — Telephone Encounter (Signed)
Patient has come down with bronchitis. Will call in Levaquin 500 milligrams daily for 10 days to [facility] with one refill. She has inhalers on hand.

## 2013-01-04 ENCOUNTER — Other Ambulatory Visit: Payer: Self-pay | Admitting: *Deleted

## 2013-01-04 MED ORDER — PRAVASTATIN SODIUM 40 MG PO TABS: 40.0000 mg | ORAL_TABLET | Freq: Every day | ORAL | Status: DC

## 2013-02-01 ENCOUNTER — Telehealth: Payer: Self-pay | Admitting: Internal Medicine

## 2013-02-01 NOTE — Telephone Encounter (Signed)
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 Call in Tamiflu 75 mg daily for 10 days to [facility]

## 2013-02-01 NOTE — Progress Notes (Signed)
Subjective:    Patient ID: Donna Reynolds, female    DOB: February 03, 1955, 58 y.o.   MRN: [id number]  HPI 58 year old Female for health maintenance and evaluation of medical issues. History of essential hypertension, hyperlipidemia, allergic rhinitis, asthma, GE reflux, hypothyroidism. History of polycystic ovary syndrome.  She was allergy tested by Dr. Donneta Romberg in 2010 was found to have positive skin test to house dust, dust mites, cockroach, mold mix, slight reaction to grass. Not positive to trees, dog, cat or feathers. Selective food testing was negative. Is prominent tree was normal.  In 2011 she had some sciatica in her right leg for which tramadol as prescribed.  Occasionally has some dependent edema and takes Lasix 40 mg daily.  GYN physician is Dr. Edwinna Areola.  Has had benign breast biopsy in the past.  A cardiology workup March 2012 for chest pain which was unremarkable.  Had endoscopy and colonoscopy by Dr. Collene Mares 2009. There was an isolated diverticulum in her left colon otherwise normal colonoscopy. Had endoscopy at the time of colonoscopy because of melena. She been taking a lot of ibuprofen for dental problems. Endoscopy showed some gastric polyps.  History of shingles in 2006.  History of left lower lobe pneumonia August 2006. Had Pneumovax in 1998.  History of diverticulitis in 2001.  Gets annual influenza immunization.  Positive measles titer 1989.  Laparoscopic cholecystectomy March 1996.  History of mononucleosis in the 1980s.  Hospitalized for chickenpox in 1990  History of migraine headaches. 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 She's been on Topamax in the past per Dr. Jannifer Franklin. 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 Has one adult daughter who is married and now lives in [place].  Patient is a native of [place] and attending [employer] for medical school. Her parents immigrated from [place]. She is an only child. She is a nonsmoker. Social alcohol consumption. She did her Web designer at [employer].    Family history: Father died with complications of congestive heart failure, had history of chronic kidney disease, and had a history of bladder cancer. Mother with history of hypertension and hyperlipidemia still living.        Review of Systems  Constitutional: Negative.   HENT: Negative.   Eyes: Negative.   Respiratory:       History of asthma  Cardiovascular: Negative.   Gastrointestinal:       History of GE reflux  Endocrine:       History of hypothyroidism and anti-sella syndrome with pituitary adenoma  Allergic/Immunologic: Positive for environmental allergies.  Neurological:       History of migraine headaches  Hematological: Negative.   Psychiatric/Behavioral:       History of depression       Objective:   Physical Exam  Vitals reviewed. Constitutional: She is oriented to person, place, and time. She appears well-developed and well-nourished. No distress.  HENT:  Head: Normocephalic and atraumatic.  Left Ear: External ear normal.  Mouth/Throat: Oropharynx is clear and moist. No oropharyngeal exudate.  Eyes: Conjunctivae and EOM are normal. Pupils are equal, round, and reactive to light. Right eye exhibits no discharge. Left eye exhibits no discharge. No scleral icterus.  Neck: Neck supple. No JVD present. No thyromegaly present.  Cardiovascular: Normal rate, regular rhythm, normal heart sounds and intact distal pulses.  No murmur heard. Pulmonary/Chest: Effort normal and breath sounds normal. No respiratory distress. She has no wheezes. She has no rales. She exhibits no tenderness.  Breasts normal female  Abdominal: Bowel sounds are normal. She exhibits no distension and no mass. There is no rebound and no guarding.   Genitourinary:  Deferred to GYN physician  Musculoskeletal: Normal range of motion. She exhibits no edema.  Lymphadenopathy:    She has no cervical adenopathy.  Neurological: She is alert and oriented to person, place, and time. She has normal reflexes. No cranial nerve deficit. Coordination normal.  Skin: Skin is warm and dry. No rash noted. She is not diaphoretic. No erythema. No pallor.  Psychiatric: She has a normal mood and affect. Her behavior is normal. Judgment and thought content normal.          Assessment & Plan:  History of allergic rhinitis  History of asthma  History of GE reflux  History of depression treated with SSRI  Anxiety  Hypothyroidism  History of polycystic ovary syndrome and postmenopausal bleeding status post D&C 2013. She does have an IUD per GYN physician.  History of hyperlipidemia treated with statin medication  History of hypertension  Weight gain-5-1/2 pounds since January 2013  Obesity  Occasional urge urinary incontinence  Plan: Return in one year or as needed. Needs to have annual mammogram.

## 2013-02-05 ENCOUNTER — Other Ambulatory Visit: Payer: Self-pay

## 2013-02-05 ENCOUNTER — Other Ambulatory Visit: Payer: No Typology Code available for payment source | Admitting: Internal Medicine

## 2013-02-05 MED ORDER — CITALOPRAM HYDROBROMIDE 20 MG PO TABS: 20.0000 mg | ORAL_TABLET | Freq: Every day | ORAL | Status: DC

## 2013-02-12 ENCOUNTER — Ambulatory Visit: Payer: No Typology Code available for payment source | Admitting: Internal Medicine

## 2013-03-01 ENCOUNTER — Other Ambulatory Visit: Payer: No Typology Code available for payment source | Admitting: Internal Medicine

## 2013-03-01 DIAGNOSIS — Z79899 Other long term (current) drug therapy: Secondary | ICD-10-CM

## 2013-03-01 DIAGNOSIS — Z0184 Encounter for antibody response examination: Secondary | ICD-10-CM

## 2013-03-01 DIAGNOSIS — E785 Hyperlipidemia, unspecified: Secondary | ICD-10-CM

## 2013-03-01 DIAGNOSIS — E039 Hypothyroidism, unspecified: Secondary | ICD-10-CM

## 2013-03-01 LAB — LIPID PANEL
CHOL/HDL RATIO: 2.3 ratio
Cholesterol: 129 mg/dL (ref 0–200)
HDL: 57 mg/dL (ref >39)
LDL CALC: 60 mg/dL (ref 0–99)
TRIGLYCERIDES: 60 mg/dL (ref <150)
VLDL: 12 mg/dL (ref 0–40)

## 2013-03-01 LAB — HEPATIC FUNCTION PANEL
ALBUMIN: 4 g/dL (ref 3.5–5.2)
ALT: 9 U/L (ref 0–35)
AST: 13 U/L (ref 0–37)
Alkaline Phosphatase: 75 U/L (ref 39–117)
BILIRUBIN TOTAL: 0.5 mg/dL (ref 0.2–1.2)
Bilirubin, Direct: 0.1 mg/dL (ref 0.0–0.3)
Indirect Bilirubin: 0.4 mg/dL (ref 0.2–1.2)
TOTAL PROTEIN: 6.8 g/dL (ref 6.0–8.3)

## 2013-03-01 LAB — TSH: TSH: 1.541 u[IU]/mL (ref 0.350–4.500)

## 2013-03-01 LAB — HEPATITIS B SURFACE ANTIBODY,QUALITATIVE: Hep B S Ab: POSITIVE — AB

## 2013-03-16 ENCOUNTER — Encounter: Payer: Self-pay | Admitting: Internal Medicine

## 2013-03-16 ENCOUNTER — Ambulatory Visit (INDEPENDENT_AMBULATORY_CARE_PROVIDER_SITE_OTHER): Payer: BC Managed Care – PPO | Admitting: Internal Medicine

## 2013-03-16 VITALS — BP 106/82 | HR 80 | Temp 98.8°F | Wt 195.0 lb

## 2013-03-16 DIAGNOSIS — E785 Hyperlipidemia, unspecified: Secondary | ICD-10-CM

## 2013-03-16 DIAGNOSIS — K219 Gastro-esophageal reflux disease without esophagitis: Secondary | ICD-10-CM

## 2013-03-16 DIAGNOSIS — E039 Hypothyroidism, unspecified: Secondary | ICD-10-CM

## 2013-03-16 DIAGNOSIS — I10 Essential (primary) hypertension: Secondary | ICD-10-CM

## 2013-04-27 ENCOUNTER — Ambulatory Visit (INDEPENDENT_AMBULATORY_CARE_PROVIDER_SITE_OTHER): Payer: BC Managed Care – PPO | Admitting: Obstetrics & Gynecology

## 2013-04-27 ENCOUNTER — Encounter: Payer: Self-pay | Admitting: Obstetrics & Gynecology

## 2013-04-27 VITALS — BP 98/66 | HR 86 | Resp 20 | Ht 62.5 in | Wt 194.0 lb

## 2013-04-27 DIAGNOSIS — Z01419 Encounter for gynecological examination (general) (routine) without abnormal findings: Secondary | ICD-10-CM

## 2013-04-27 DIAGNOSIS — Z124 Encounter for screening for malignant neoplasm of cervix: Secondary | ICD-10-CM

## 2013-04-27 NOTE — Progress Notes (Signed)
58 y.o. [id number] MarriedCaucasianF here for annual exam.  Daughter has recently moved to [facility].  Really great for patient.  Daughter is doing Investment banker, corporate.    No vaginal bleeding.  Has IUD for hyperplasia.  Sees Dr. Renold Genta every six months.  Working now with Hospice.    No LMP recorded. Patient is not currently having periods (Reason: IUD).          Sexually active: yes  The current method of family planning is IUD.    Exercising: yes  Walk 3 -4 x week Smoker:  no  Health Maintenance: Pap:  04/2011 Neg. HR HPV: NEG History of abnormal Pap:  no MMG:  05/2012 BI-RADS 1: NEG Colonoscopy:  2008 - 10 years BMD:   01/2009 TDaP:  2008 Screening Labs: PCP, Hb today: PCP, Urine today: PCP   reports that she has never smoked. She has never used smokeless tobacco. She reports that she does not drink alcohol or use illicit drugs.  Past Medical History  Diagnosis Date  . HTN (hypertension)   . Asthma   . Dyslipidemia   . Hypothyroidism   . GERD (gastroesophageal reflux disease)   . Anxiety     Past Surgical History  Procedure Laterality Date  . Cardiac catheterization    . Cholecystectomy    . Breast surgery Left 2000    B[place]  . Diagnostic laparoscopy    . Dilation and curettage of uterus  04/12/2011    Procedure: DILATATION AND CURETTAGE;  Surgeon: Megan Salon, MD;  Location: [facility];  Service: Gynecology;  Laterality: N/A;    Current Outpatient Prescriptions  Medication Sig Dispense Refill  . acetaminophen (TYLENOL) 500 MG tablet Take 1,000 mg by mouth as needed. For pain       . aspirin 81 MG EC tablet Take 81 mg by mouth daily. Stopped taking on 04/07/11 in preparation for surgery.      . cholecalciferol (VITAMIN D) 1000 UNITS tablet Take 1,000 Units by mouth daily.      . citalopram (CELEXA) 20 MG tablet Take 10 mg by mouth daily.      . fexofenadine (ALLEGRA) 180 MG tablet Take 180 mg by mouth daily.      . Fluticasone-Salmeterol (ADVAIR DISKUS) 250-50 MCG/DOSE  AEPB Inhale 1 puff into the lungs 2 (two) times daily.  60 each  6  . ibuprofen (ADVIL,MOTRIN) 200 MG tablet Take 400 mg by mouth every 6 (six) hours as needed. For pain      . lansoprazole (PREVACID) 30 MG capsule Take 15 mg by mouth daily.       Marland Kitchen levonorgestrel (MIRENA) 20 MCG/24HR IUD 1 each by Intrauterine route once.      Marland Kitchen levothyroxine (SYNTHROID, LEVOTHROID) 75 MCG tablet Take 1 tablet (75 mcg total) by mouth daily.  90 tablet  3  . losartan-hydrochlorothiazide (HYZAAR) 50-12.5 MG per tablet Take 1 tablet by mouth daily.  30 tablet  6  . Multiple Vitamins-Minerals (MULTIVITAMIN PO) Take by mouth.      . pravastatin (PRAVACHOL) 40 MG tablet Take 1 tablet (40 mg total) by mouth at bedtime.  30 tablet  5  . Magnesium 400 MG CAPS Take by mouth.      . montelukast (SINGULAIR) 10 MG tablet Take 1 tablet (10 mg total) by mouth at bedtime.  30 tablet  11   No current facility-administered medications for this visit.    Family History  Problem Relation Age of Onset  . Coronary artery  disease Father   . COPD Father   . Cancer Father     Bladder  . Hyperlipidemia Mother   . Hypertension Mother     ROS:  Pertinent items are noted in HPI.  Otherwise, a comprehensive ROS was negative.  Exam:   BP 98/66  Pulse 86  Resp 20  Ht 5' 2.5" (1.588 m)  Wt 194 lb (87.998 kg)  BMI 34.90 kg/m2  Weight change: stable  Height: 5' 2.5" (158.8 cm)  Ht Readings from Last 3 Encounters:  04/27/13 5' 2.5" (1.588 m)  08/14/12 5' 2.75" (1.594 m)  04/26/12 5' 2.5" (1.588 m)    General appearance: alert, cooperative and appears stated age Head: Normocephalic, without obvious abnormality, atraumatic Neck: no adenopathy, supple, symmetrical, trachea midline and thyroid normal to inspection and palpation Lungs: clear to auscultation bilaterally Breasts: normal appearance, no masses or tenderness Heart: regular rate and rhythm Abdomen: soft, non-tender; bowel sounds normal; no masses,  no  organomegaly Extremities: extremities normal, atraumatic, no cyanosis or edema Skin: Skin color, texture, turgor normal. No rashes or lesions Lymph nodes: Cervical, supraclavicular, and axillary nodes normal. No abnormal inguinal nodes palpated Neurologic: Grossly normal   Pelvic: External genitalia:  no lesions              Urethra:  normal appearing urethra with no masses, tenderness or lesions              Bartholins and Skenes: normal                 Vagina: normal appearing vagina with normal color and discharge, no lesions              Cervix: no lesions              Pap taken: yes Bimanual Exam:  Uterus:  normal size, contour, position, consistency, mobility, non-tender              Adnexa: normal adnexa and no mass, fullness, tenderness               Rectovaginal: Confirms               Anus:  normal sphincter tone, no lesions  A:  Well Woman with normal exam Elevated lipids Hypertension Pelvic relaxation Hypothyroidism  H/O simple hyperplasia without atypic 2/13.  F/U bx 9/13 neg.  IUD placed 3/13.  Due out 3/18.  P:   Mammogram yearly pap smear today Labs with Dr. Renold Genta IUD for hyperplasia return annually or prn  An After Visit Summary was printed and given to the patient.

## 2013-04-30 LAB — IPS PAP SMEAR ONLY

## 2013-06-11 ENCOUNTER — Encounter: Payer: Self-pay | Admitting: Nurse Practitioner

## 2013-06-11 ENCOUNTER — Telehealth: Payer: Self-pay | Admitting: Obstetrics & Gynecology

## 2013-06-11 ENCOUNTER — Ambulatory Visit (INDEPENDENT_AMBULATORY_CARE_PROVIDER_SITE_OTHER): Payer: No Typology Code available for payment source | Admitting: Nurse Practitioner

## 2013-06-11 VITALS — BP 124/76 | HR 80 | Resp 18 | Wt 195.8 lb

## 2013-06-11 DIAGNOSIS — N950 Postmenopausal bleeding: Secondary | ICD-10-CM

## 2013-06-11 DIAGNOSIS — Z30431 Encounter for routine checking of intrauterine contraceptive device: Secondary | ICD-10-CM

## 2013-06-11 DIAGNOSIS — N949 Unspecified condition associated with female genital organs and menstrual cycle: Secondary | ICD-10-CM

## 2013-06-11 DIAGNOSIS — R102 Pelvic and perineal pain: Secondary | ICD-10-CM

## 2013-06-11 NOTE — Progress Notes (Signed)
Subjective:     Patient ID: Donna Reynolds, female   DOB: [birth year], 58 y.o.   MRN: [id number]  HPI  This 58 yo MW Fe has a history of endometrial hyperplasia.  She had a D&C in 04/2011 and IUD Mirena placement afterwards on  04/19/11.  Yesterday am started having lower abdominal cramping with light brown vaginal bleeding.  Mostly with wiping.  She started checking her IUD strings and they seem to be a little longer than usual.  She was concerned that the IUD may be coming out.  No urinary symptoms.  No other GI symptoms.  They have just returned from a trip to [place] and [place] where they have done a lot of hiking and walking.  Onset of symptoms were not initiated with SA.   Review of Systems  Constitutional: Negative for fever, chills and fatigue.  Respiratory: Negative.   Cardiovascular: Negative.   Gastrointestinal: Negative.  Negative for nausea, vomiting, abdominal pain, diarrhea, constipation, blood in stool, abdominal distention and anal bleeding.  Genitourinary: Positive for menstrual problem and pelvic pain. Negative for dysuria, urgency, frequency, hematuria, flank pain, decreased urine volume, vaginal discharge, genital sores, vaginal pain and dyspareunia.       As noted vaginal bleeding  Musculoskeletal: Negative.   Skin: Negative.   Neurological: Negative.   Psychiatric/Behavioral: Negative.        Objective:   Physical Exam  Constitutional: She is oriented to person, place, and time. She appears well-developed and well-nourished. No distress.  Abdominal: Soft. She exhibits no distension. There is no tenderness. There is no guarding.  Genitourinary:  Light pink to brown vaginal discharge.  IUD strings are visible and are about 2 " long.  Neurological: She is alert and oriented to person, place, and time.  Psychiatric: She has a normal mood and affect. Her behavior is normal. Judgment and thought content normal.       Assessment:     History of endometrial hyperplasia  - now PMB History of Mirena IUD with placement 04/2011 Lower abdominal cramps   Plan:     Discussed with Dr. Sabra Heck Plan PUS for Thursday and follow - if endo lining is thicker may need endo biopsy as well. patient is agreeable

## 2013-06-11 NOTE — Patient Instructions (Signed)
You are scheduled for Thursday - PUS

## 2013-06-11 NOTE — Telephone Encounter (Signed)
Patient said she is having some spotting (very light), and having some cramping( like menstrual cramps). Started yesterday. Has and IUD and said that the string feel different as well. Wants to talk to the nurse to make sure everything is  okay

## 2013-06-11 NOTE — Telephone Encounter (Signed)
Thank you. Encounter closed. 

## 2013-06-11 NOTE — Telephone Encounter (Signed)
Spoke with patient. States that yesterday she has some light brown spotting, with back pain and lower abdominal pain, which patient describes as lower abdominal cramps. She states "this is very unusual for me" she states she has not had any bleeding or cramping "in many years." States that cramps are "not bad" but that she notices them and again states that this is unusual for her.  Patient states she checked her IUD strings and they feel "much longer and harder."  Patient states she she is currently at work. Office visit scheduled with Milford Cage, FNP for IUD check.    cc Dr. Sabra Heck.

## 2013-06-14 ENCOUNTER — Ambulatory Visit (INDEPENDENT_AMBULATORY_CARE_PROVIDER_SITE_OTHER): Payer: No Typology Code available for payment source

## 2013-06-14 ENCOUNTER — Ambulatory Visit (INDEPENDENT_AMBULATORY_CARE_PROVIDER_SITE_OTHER): Payer: No Typology Code available for payment source | Admitting: Obstetrics & Gynecology

## 2013-06-14 DIAGNOSIS — N950 Postmenopausal bleeding: Secondary | ICD-10-CM

## 2013-06-14 DIAGNOSIS — N949 Unspecified condition associated with female genital organs and menstrual cycle: Secondary | ICD-10-CM

## 2013-06-14 DIAGNOSIS — Z30431 Encounter for routine checking of intrauterine contraceptive device: Secondary | ICD-10-CM

## 2013-06-14 DIAGNOSIS — R102 Pelvic and perineal pain: Secondary | ICD-10-CM

## 2013-06-14 NOTE — Progress Notes (Signed)
58 y.o. G56P1 Married White female here for a pelvic ultrasound due to episode of PMP bleeding.  Pt with hx of simple endometrial hyperplasia without atypia treated with D&C and then Mirena IUD placed 3/13.  Repeat biopsy 9/13 was negative.  Pt reports she did have some PMS symptoms before bleeding started.  This has resolved.  No LMP recorded. Patient is not currently having periods (Reason: IUD).  Sexually active:  yes  Contraception: menopause  FINDINGS: UTERUS: 7.7 x 4.6 x 3.4cm.  24mm calcification noted.  No masses.  IUD in correct location. EMS: 3.29mm ADNEXA:   Left ovary 2.8 x 1.4 x 1.5cm with 1.3 x 1.3 x 1.0cm simple appearing cyst   Right ovary 2.1 x 0.9 x 1.0cm CUL DE SAC: no free fluid  D/w pt results of u/s.  Images reviewed.  Has a new, simple appearing, left ovarian cyst.  This appears benign.  On prior u/s, this was not present.  Feel endometrial biopsy not necessary and that she is at very low risk for recurrent hyperplasia.  Pt in agreement with plan.  She is a physician.  Assessment:  PMP bleeding.  H/O simple endometrial hyperplasia without atypia.  Mirena IUD since 9/13. Plan: Pt knows to call with any future bleeding and I would repeat a biopsy then.  ~15 minutes spent with patient >50% of time was in face to face discussion of above.

## 2013-06-14 NOTE — Progress Notes (Signed)
Reviewed personally.  M. Suzanne Nikala Walsworth, MD.  

## 2013-06-27 ENCOUNTER — Encounter: Payer: Self-pay | Admitting: Obstetrics & Gynecology

## 2013-07-26 ENCOUNTER — Ambulatory Visit: Payer: No Typology Code available for payment source | Admitting: Obstetrics & Gynecology

## 2013-08-23 ENCOUNTER — Other Ambulatory Visit: Payer: Self-pay

## 2013-08-23 MED ORDER — LEVOTHYROXINE SODIUM 75 MCG PO TABS: 75.0000 ug | ORAL_TABLET | Freq: Every day | ORAL | Status: DC

## 2013-09-07 ENCOUNTER — Other Ambulatory Visit: Payer: No Typology Code available for payment source | Admitting: Internal Medicine

## 2013-09-09 NOTE — Patient Instructions (Signed)
Continue same medications and return in 6 months 

## 2013-09-09 NOTE — Progress Notes (Signed)
   Subjective:    Patient ID: Felton Clinton, female    DOB: [birth year], 58 y.o.   MRN: [id number]  HPI  In today for six-month followup of hypertension, hyperlipidemia, hypothyroidism, and GE reflux. No new complaints or problems. Feels well. Starting new job with Hospice.    Review of Systems     Objective:   Physical Exam Skin warm and dry. Nodes none. Chest clear to auscultation. Cardiac exam regular rate and rhythm. No thyromegaly. Extremities without edema.       Assessment & Plan:  Hypothyroidism-TSH stable on current regimen. Current dose is 0.075 mg daily  Hyperlipidemia-lipid panel liver functions are entirely within normal limits on statin medication/Pravachol  GE reflux-treat with PPI  Hypertension-stable with Hyzaar 50/12.5 daily  Plan: Return for physical examination in 6 months.

## 2013-09-10 ENCOUNTER — Other Ambulatory Visit: Payer: No Typology Code available for payment source | Admitting: Internal Medicine

## 2013-09-10 DIAGNOSIS — Z0000 Encounter for general adult medical examination without abnormal findings: Secondary | ICD-10-CM

## 2013-09-10 DIAGNOSIS — E785 Hyperlipidemia, unspecified: Secondary | ICD-10-CM

## 2013-09-10 DIAGNOSIS — E039 Hypothyroidism, unspecified: Secondary | ICD-10-CM

## 2013-09-10 LAB — LIPID PANEL
CHOLESTEROL: 135 mg/dL (ref 0–200)
HDL: 54 mg/dL (ref >39)
LDL Cholesterol: 59 mg/dL (ref 0–99)
TRIGLYCERIDES: 109 mg/dL (ref <150)
Total CHOL/HDL Ratio: 2.5 Ratio
VLDL: 22 mg/dL (ref 0–40)

## 2013-09-10 LAB — CBC WITH DIFFERENTIAL/PLATELET
BASOS ABS: 0.1 10*3/uL (ref 0.0–0.1)
Basophils Relative: 1 % (ref 0–1)
Eosinophils Absolute: 0.2 10*3/uL (ref 0.0–0.7)
Eosinophils Relative: 2 % (ref 0–5)
HCT: 41.5 % (ref 36.0–46.0)
HEMOGLOBIN: 14.2 g/dL (ref 12.0–15.0)
LYMPHS ABS: 2.6 10*3/uL (ref 0.7–4.0)
LYMPHS PCT: 28 % (ref 12–46)
MCH: 30.5 pg (ref 26.0–34.0)
MCHC: 34.2 g/dL (ref 30.0–36.0)
MCV: 89.1 fL (ref 78.0–100.0)
Monocytes Absolute: 1 10*3/uL (ref 0.1–1.0)
Monocytes Relative: 11 % (ref 3–12)
NEUTROS ABS: 5.3 10*3/uL (ref 1.7–7.7)
Neutrophils Relative %: 58 % (ref 43–77)
Platelets: 313 10*3/uL (ref 150–400)
RBC: 4.66 MIL/uL (ref 3.87–5.11)
RDW: 15.1 % (ref 11.5–15.5)
WBC: 9.2 10*3/uL (ref 4.0–10.5)

## 2013-09-10 LAB — COMPREHENSIVE METABOLIC PANEL
ALBUMIN: 4.2 g/dL (ref 3.5–5.2)
ALT: 16 U/L (ref 0–35)
AST: 16 U/L (ref 0–37)
Alkaline Phosphatase: 76 U/L (ref 39–117)
BUN: 16 mg/dL (ref 6–23)
CALCIUM: 9.6 mg/dL (ref 8.4–10.5)
CHLORIDE: 97 meq/L (ref 96–112)
CO2: 25 meq/L (ref 19–32)
Creat: 0.79 mg/dL (ref 0.50–1.10)
Glucose, Bld: 83 mg/dL (ref 70–99)
POTASSIUM: 3.8 meq/L (ref 3.5–5.3)
Sodium: 135 mEq/L (ref 135–145)
Total Bilirubin: 1 mg/dL (ref 0.2–1.2)
Total Protein: 7.1 g/dL (ref 6.0–8.3)

## 2013-09-10 LAB — TSH: TSH: 0.987 u[IU]/mL (ref 0.350–4.500)

## 2013-09-11 ENCOUNTER — Encounter: Payer: No Typology Code available for payment source | Admitting: Internal Medicine

## 2013-09-11 LAB — VITAMIN D 25 HYDROXY (VIT D DEFICIENCY, FRACTURES): Vit D, 25-Hydroxy: 44 ng/mL (ref 30–89)

## 2013-10-11 ENCOUNTER — Encounter: Payer: No Typology Code available for payment source | Admitting: Internal Medicine

## 2013-10-25 ENCOUNTER — Ambulatory Visit (INDEPENDENT_AMBULATORY_CARE_PROVIDER_SITE_OTHER): Payer: No Typology Code available for payment source | Admitting: Internal Medicine

## 2013-10-25 ENCOUNTER — Encounter: Payer: Self-pay | Admitting: Internal Medicine

## 2013-10-25 VITALS — BP 110/70 | HR 72 | Ht 62.0 in | Wt 189.0 lb

## 2013-10-25 DIAGNOSIS — K219 Gastro-esophageal reflux disease without esophagitis: Secondary | ICD-10-CM

## 2013-10-25 DIAGNOSIS — E039 Hypothyroidism, unspecified: Secondary | ICD-10-CM

## 2013-10-25 DIAGNOSIS — E236 Other disorders of pituitary gland: Secondary | ICD-10-CM

## 2013-10-25 DIAGNOSIS — I10 Essential (primary) hypertension: Secondary | ICD-10-CM

## 2013-10-25 DIAGNOSIS — J309 Allergic rhinitis, unspecified: Secondary | ICD-10-CM

## 2013-10-25 DIAGNOSIS — E785 Hyperlipidemia, unspecified: Secondary | ICD-10-CM

## 2013-10-25 DIAGNOSIS — Z8709 Personal history of other diseases of the respiratory system: Secondary | ICD-10-CM

## 2013-10-25 DIAGNOSIS — Z8669 Personal history of other diseases of the nervous system and sense organs: Secondary | ICD-10-CM

## 2013-10-25 MED ORDER — HYDROCODONE-ACETAMINOPHEN 5-325 MG PO TABS: 1.0000 | ORAL_TABLET | Freq: Four times a day (QID) | ORAL | Status: DC | PRN

## 2013-11-23 ENCOUNTER — Other Ambulatory Visit: Payer: Self-pay

## 2013-11-23 MED ORDER — PRAVASTATIN SODIUM 40 MG PO TABS: 40.0000 mg | ORAL_TABLET | Freq: Every day | ORAL | Status: DC

## 2013-12-03 ENCOUNTER — Encounter: Payer: Self-pay | Admitting: Internal Medicine

## 2013-12-28 NOTE — Progress Notes (Signed)
Subjective:    Patient ID: Donna Reynolds, female    DOB: [birth year], 58 y.o.   MRN: [id number]  HPI  58 year old Female in today for health maintenance exam. Dr. Edwinna Areola is GYN physician. Patient has a history of essential hypertension, hyperlipidemia, allergic rhinitis, asthma, GE reflux, hypothyroidism. History of polycystic ovary syndrome.  Patient was allergy tested by Dr. Donneta Romberg in 2010. Was found to have positive skin test to house dust, dust mites, cockroach, mold mix, slight reaction to grass. Was not positive to trees, dog, cat or feathers. Selected food testing was negative. Spirometry was normal.  In 2011, she had some sciatica in her right leg for which Tramadol was prescribed. Occasionally has some dependent edema and takes 40 mg of Lasix daily on a when necessary basis. History of benign breast biopsy. Patient had cardiology evaluation March 2012 for chest pain which was unremarkable.  She had endoscopy and colonoscopy by Dr. Ellsworth Lennox in 2009. There was an isolated diverticulum in her left colon otherwise normal colonoscopy. Endoscopy was done at the time of colonoscopy because of melena. She had taken ibuprofen for dental problems. Endoscopy showed some gastric polyps.  History of shingles in 2006. History of left lower pneumonia August 2006. Had Pneumovax in 1998. History of diverticulitis in 2001. Positive measles titer 1989. Laparoscopic cholecystectomy March 1996. History of mononucleosis in the 1980s. Hospitalized for chickenpox in 1990.  History of migraine headaches. 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 She has taken Topamax in the past per Dr. Jannifer Franklin. 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 Has one adult daughter who is married and now lives in [place]. Patient is a native  of [place] and attended [employer]. Her parents immigrated from [place]. She is an only child. Nonsmoker. Social alcohol consumption. 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  Family history: Father died with complications of congestive heart failure, had history of chronic kidney disease and bladder cancer. Mother with history of hypertension and hyperlipidemia still living.  Patient has history of postmenopausal bleeding status post D&C 2013. She does have an IUD per GYN physician.    Review of Systems  Gastrointestinal:       GERD  Endocrine:       History of polycystic ovary syndrome, history of empty sella syndrome with small pituitary adenoma  Allergic/Immunologic:       History of asthma and allergic rhinitis  Neurological:       History of migraine headaches       Objective:   Physical Exam  Constitutional: She appears well-developed and well-nourished. No distress.  HENT:  Head: Normocephalic and atraumatic.  Right Ear: External ear normal.  Left Ear: External ear normal.  Mouth/Throat: Oropharynx is clear and moist. No oropharyngeal exudate.  Eyes: Conjunctivae and EOM are normal. Pupils are equal, round, and reactive to light. Right eye exhibits no discharge. Left eye exhibits no discharge. No scleral icterus.  Neck: Neck supple. No JVD present. No thyromegaly present.  Cardiovascular: Normal rate, regular rhythm, normal heart sounds and intact distal pulses.   No murmur heard. Pulmonary/Chest: Effort normal and breath sounds normal. No respiratory distress. She has no wheezes. She has no rales.  Breasts normal female  Abdominal: Soft. Bowel sounds are normal.  She exhibits no distension and no mass. There is no tenderness. There is no rebound and no guarding.  Genitourinary:  Deferred to GYN  Musculoskeletal: She exhibits no edema.  Lymphadenopathy:    She has no cervical adenopathy.    Neurological: She is alert. She has normal reflexes. No cranial nerve deficit. Coordination normal.  Skin: Skin is warm and dry. No rash noted. She is not diaphoretic.  Psychiatric: She has a normal mood and affect. Her behavior is normal. Judgment and thought content normal.  Vitals reviewed.         Assessment & Plan:  Essential hypertension-stable  Hyperlipidemia-stable  History of allergic rhinitis  History of GE reflux  History of asthma  History of hypothyroidism  History of empty sella syndrome and small pituitary adenoma  History of migraine headaches  History of polycystic ovary syndrome  Plan: Return in one year or as needed. Continue same medications. Patient gets annual flu vaccine.

## 2013-12-28 NOTE — Patient Instructions (Signed)
Continue same medications and return in one year. 

## 2014-01-03 ENCOUNTER — Other Ambulatory Visit: Payer: Self-pay | Admitting: *Deleted

## 2014-01-03 MED ORDER — LOSARTAN POTASSIUM-HCTZ 50-12.5 MG PO TABS: 1.0000 | ORAL_TABLET | Freq: Every day | ORAL | Status: DC

## 2014-01-13 IMAGING — CR DG CHEST 2V
2 series · 2 of 2 positions shown · non-contrast
Comparison: 03/13/2010

CLINICAL DATA: Fever and headache.

CHEST - 2 VIEW

[view not recorded (1 of 2)]
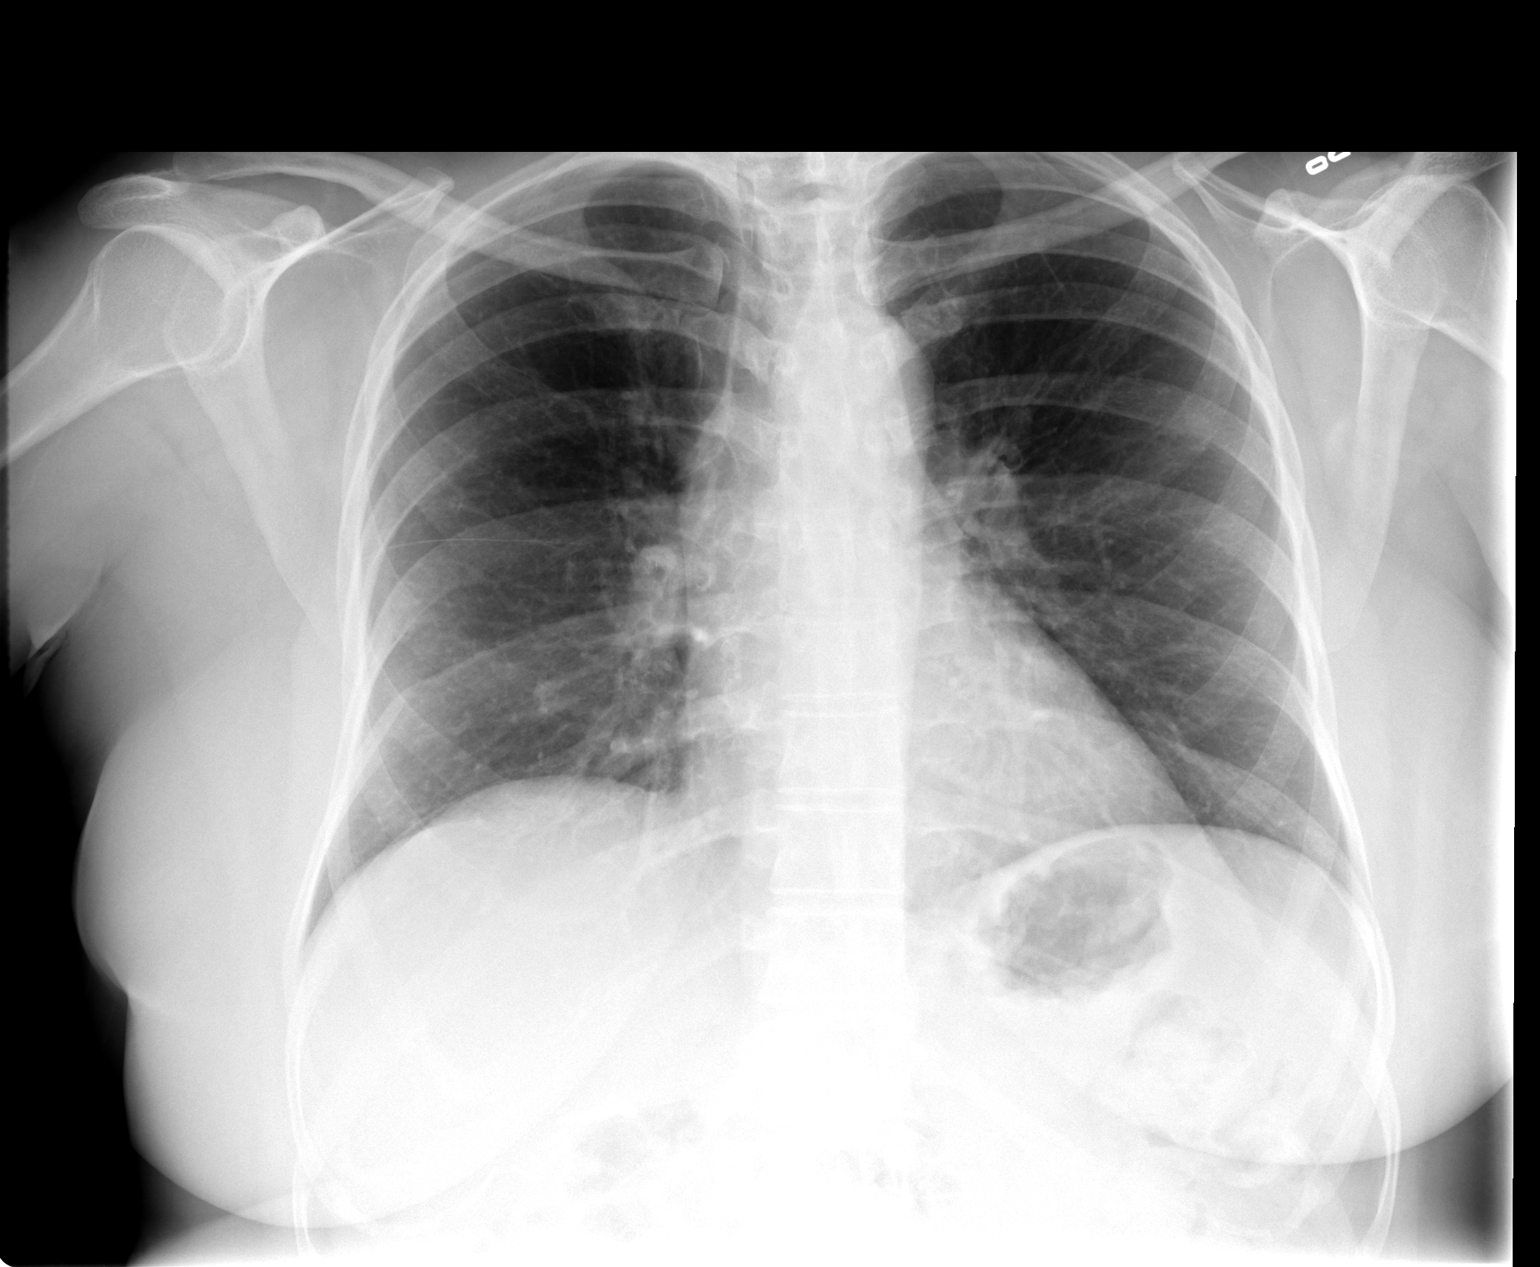

[view not recorded (2 of 2)]
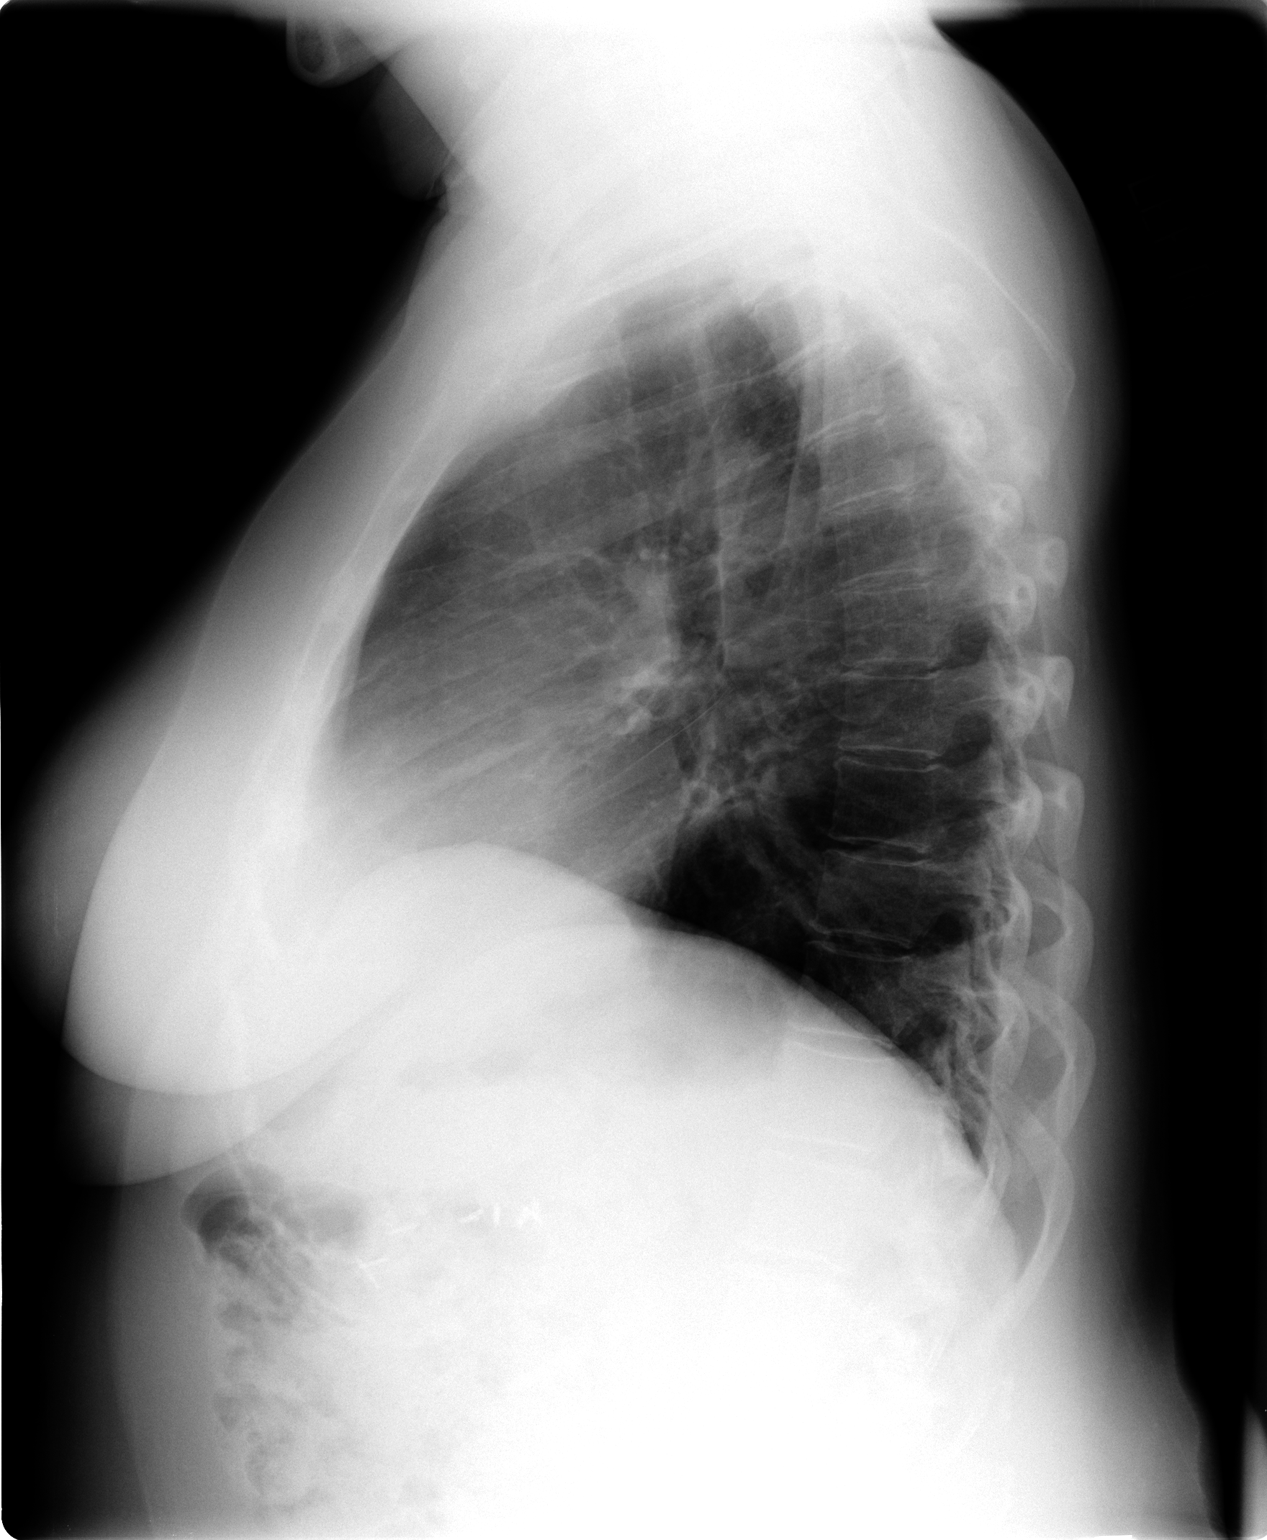

[2 of 2 positions shown; findings below may reference images not displayed]

FINDINGS: The lungs are clear without focal infiltrate, edema,
pneumothorax or pleural effusion. Cardiopericardial silhouette is
at upper limits of normal for size. Imaged bony structures of the
thorax are intact.
IMPRESSION: Stable.  No acute findings.

## 2014-03-08 ENCOUNTER — Other Ambulatory Visit: Payer: Self-pay | Admitting: *Deleted

## 2014-03-08 MED ORDER — CITALOPRAM HYDROBROMIDE 20 MG PO TABS
20.0000 mg | ORAL_TABLET | Freq: Every day | ORAL | Status: DC
Start: 2014-03-08 — End: 2014-04-09

## 2014-03-08 NOTE — Telephone Encounter (Signed)
Refill on Citalopram faxed to pharmacy by Dr Renold Genta

## 2014-04-09 ENCOUNTER — Other Ambulatory Visit: Payer: Self-pay | Admitting: *Deleted

## 2014-04-09 MED ORDER — CITALOPRAM HYDROBROMIDE 20 MG PO TABS: 20.0000 mg | ORAL_TABLET | Freq: Every day | ORAL | Status: DC

## 2014-04-09 NOTE — Telephone Encounter (Signed)
celexa refills sent to patient pharmacy

## 2014-04-23 ENCOUNTER — Other Ambulatory Visit: Payer: No Typology Code available for payment source | Admitting: Internal Medicine

## 2014-04-23 DIAGNOSIS — Z1329 Encounter for screening for other suspected endocrine disorder: Secondary | ICD-10-CM

## 2014-04-23 DIAGNOSIS — Z0000 Encounter for general adult medical examination without abnormal findings: Secondary | ICD-10-CM

## 2014-04-23 DIAGNOSIS — Z13220 Encounter for screening for lipoid disorders: Secondary | ICD-10-CM

## 2014-04-23 DIAGNOSIS — Z130 Encounter for screening for diseases of the blood and blood-forming organs and certain disorders involving the immune mechanism: Secondary | ICD-10-CM

## 2014-04-23 DIAGNOSIS — Z1321 Encounter for screening for nutritional disorder: Secondary | ICD-10-CM

## 2014-04-23 LAB — COMPLETE METABOLIC PANEL WITH GFR
ALBUMIN: 4.1 g/dL (ref 3.5–5.2)
ALK PHOS: 70 U/L (ref 39–117)
ALT: 13 U/L (ref 0–35)
AST: 16 U/L (ref 0–37)
BUN: 20 mg/dL (ref 6–23)
CO2: 30 mEq/L (ref 19–32)
Calcium: 9.5 mg/dL (ref 8.4–10.5)
Chloride: 103 mEq/L (ref 96–112)
Creat: 0.83 mg/dL (ref 0.50–1.10)
GFR, Est African American: >89 mL/min
GFR, Est Non African American: 78 mL/min
Glucose, Bld: 92 mg/dL (ref 70–99)
Potassium: 4.4 mEq/L (ref 3.5–5.3)
Sodium: 140 mEq/L (ref 135–145)
Total Bilirubin: 0.6 mg/dL (ref 0.2–1.2)
Total Protein: 6.8 g/dL (ref 6.0–8.3)

## 2014-04-23 LAB — CBC WITH DIFFERENTIAL/PLATELET
BASOS PCT: 1 % (ref 0–1)
Basophils Absolute: 0.1 10*3/uL (ref 0.0–0.1)
EOS ABS: 0.3 10*3/uL (ref 0.0–0.7)
Eosinophils Relative: 3 % (ref 0–5)
HEMATOCRIT: 42.6 % (ref 36.0–46.0)
Hemoglobin: 14 g/dL (ref 12.0–15.0)
LYMPHS ABS: 2.6 10*3/uL (ref 0.7–4.0)
Lymphocytes Relative: 31 % (ref 12–46)
MCH: 30.7 pg (ref 26.0–34.0)
MCHC: 32.9 g/dL (ref 30.0–36.0)
MCV: 93.4 fL (ref 78.0–100.0)
MONOS PCT: 9 % (ref 3–12)
MPV: 10.3 fL (ref 8.6–12.4)
Monocytes Absolute: 0.8 10*3/uL (ref 0.1–1.0)
NEUTROS ABS: 4.7 10*3/uL (ref 1.7–7.7)
Neutrophils Relative %: 56 % (ref 43–77)
Platelets: 282 10*3/uL (ref 150–400)
RBC: 4.56 MIL/uL (ref 3.87–5.11)
RDW: 14.2 % (ref 11.5–15.5)
WBC: 8.4 10*3/uL (ref 4.0–10.5)

## 2014-04-23 LAB — LIPID PANEL
CHOL/HDL RATIO: 2.2 ratio
Cholesterol: 116 mg/dL (ref 0–200)
HDL: 53 mg/dL (ref >=46)
LDL Cholesterol: 55 mg/dL (ref 0–99)
Triglycerides: 38 mg/dL (ref <150)
VLDL: 8 mg/dL (ref 0–40)

## 2014-04-24 LAB — TSH: TSH: 1.285 u[IU]/mL (ref 0.350–4.500)

## 2014-04-24 LAB — VITAMIN D 25 HYDROXY (VIT D DEFICIENCY, FRACTURES): VIT D 25 HYDROXY: 49 ng/mL (ref 30–100)

## 2014-04-25 ENCOUNTER — Ambulatory Visit (INDEPENDENT_AMBULATORY_CARE_PROVIDER_SITE_OTHER): Payer: No Typology Code available for payment source | Admitting: Internal Medicine

## 2014-04-25 ENCOUNTER — Encounter: Payer: Self-pay | Admitting: Internal Medicine

## 2014-04-25 VITALS — BP 98/68 | HR 72 | Temp 98.0°F | Ht 62.5 in | Wt 179.0 lb

## 2014-04-25 DIAGNOSIS — Z0000 Encounter for general adult medical examination without abnormal findings: Secondary | ICD-10-CM | POA: Diagnosis not present

## 2014-04-25 LAB — POCT URINALYSIS DIPSTICK
Bilirubin, UA: NEGATIVE
Blood, UA: NEGATIVE
Glucose, UA: NEGATIVE
Ketones, UA: NEGATIVE
Leukocytes, UA: NEGATIVE
Nitrite, UA: NEGATIVE
PH UA: 5
PROTEIN UA: NEGATIVE
Spec Grav, UA: <=1.005
UROBILINOGEN UA: NEGATIVE

## 2014-04-26 NOTE — Progress Notes (Signed)
Subjective:    Patient ID: Donna Reynolds, female    DOB: 1955-12-03, 59 y.o.   MRN: [id number]  HPI  59 year old Female in today for health maintenance exam and evaluation of medical issues. Dr. Edwinna Areola is GYN physician. Patient was seen by Dr. Sabra Heck May 2015 with an episode of postmenopausal bleeding. Had ultrasound showing new left ovarian cyst which appeared to be benign. History of endometrial hyperplasia without atypia treated March 2013 with D&C followed by Mirena IUD with repeat biopsy September 2013 negative. Still has IUD. No further episodes of bleeding.  Patient's blood pressures been running low recently. She has felt fatigued. Were going to stop antihypertensive medication and see if blood pressure improves and fatigue improves.  She has a history of essential hypertension, hyperlipidemia, allergic rhinitis, asthma, GE reflux, hypothyroidism.  She was allergy tested by Dr. Donneta Romberg and 22 and was found to have positive skin tests to house dust, dust mites, cockroach, mold mix, slight reaction to grass. Not positive to trees, dog, cat or feathers. Selective food testing was negative. Spirometry was normal.  In 2011, she had sciatica right leg for which tramadol was prescribed. Occasionally has dependent edema for which she takes Lasix when necessary. History of benign breast biopsy in the past.  Cardiology evaluation February 2012 for chest pain -negative cardiac catheterization.  Patient had an endoscopy and colonoscopy by Dr. Collene Mares 2009. There was an isolated diverticulum in her left colon otherwise normal. Had endoscopy at the time of colonoscopy because of melena. She had been taking a lot of ibuprofen for dental problems. Endoscopy showed some gastric polyps.  History of shingles in 2006.  History of left lower lobe pneumonia August 2006. Had Pneumovax in 1998.  History of diverticulitis in 2001.  Positive measles titer 1989.  Plavix Coppock cholecystectomy March  1996.  History of mononucleosis in the 1980s.  History of hospitalization for chickenpox.  History of migraine headaches. 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 Has tried Topamax in the past per Dr. Jannifer Franklin. 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  Social history: She is married to a Software engineer. One adult daughter who is married and now lives in [place]. Patient is a native of [place]. Attended [employer] Her parents immigrated from [place]. She is an only child. Nonsmoker. Social alcohol consumption. 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 Mother with history of hypertension and hyperlipidemia living.  Patient has lost 16 pounds in the past year on Weight Watchers.    Review of Systems  Constitutional: Positive for fatigue.  HENT: Negative.   Eyes: Negative.   Cardiovascular: Negative.   Gastrointestinal: Negative.   Genitourinary:       See note regarding evaluation by Dr. Sabra Heck May 2015  Hematological: Negative.   Psychiatric/Behavioral: Negative.        Objective:   Physical Exam  Constitutional: She is oriented to person, place, and time. She appears well-developed and well-nourished. No distress.  HENT:  Head: Normocephalic and atraumatic.  Right Ear: External ear normal.  Left Ear: External ear normal.  Mouth/Throat: Oropharynx is clear and moist. No oropharyngeal exudate.  Eyes: Conjunctivae are normal. Pupils are  equal, round, and reactive to light. Right eye exhibits no discharge. Left eye exhibits no discharge. No scleral icterus.  Neck: Neck supple. No JVD present. No thyromegaly present.    Cardiovascular: Normal rate, regular rhythm and normal heart sounds.   No murmur heard. Pulmonary/Chest: Effort normal and breath sounds normal. She has no wheezes. She has no rales.  Breasts normal female without masses  Abdominal: Soft. Bowel sounds are normal. She exhibits no distension and no mass. There is no tenderness. There is no rebound and no guarding.  Genitourinary:  Deferred to Dr. Edwinna Areola  Musculoskeletal: Normal range of motion. She exhibits no edema.  Lymphadenopathy:    She has no cervical adenopathy.  Neurological: She is alert and oriented to person, place, and time. She has normal reflexes. No cranial nerve deficit. Coordination normal.  Skin: Skin is warm and dry. No rash noted. She is not diaphoretic.  Psychiatric: She has a normal mood and affect. Her behavior is normal. Judgment and thought content normal.  Vitals reviewed.         Assessment & Plan:  Normal health maintenance exam  Hypotension-discontinue antihypertensive medication. Patient will monitor blood pressure and let me know if it is not well controlled off medication. Has been on Hyzaar 50/12.5  mg daily  Hyperlipidemia-normal on Pravachol 40 mg daily  History of asthma-has inhalers on hand  History of allergic rhinitis  Hypothyroidism-TSH stable on current dose of Synthroid  GE reflux-stable with Prevacid 30 mg daily  History of postmenopausal bleeding evaluated by GYN in May 2015 with new left ovarian cyst on ultrasound. Has Mirena IUD.

## 2014-04-26 NOTE — Patient Instructions (Signed)
Discontinue Hyzaar. Monitor blood pressure at home. Was a pleasure to see you today. Congratulations on 16 pound weight loss over the past year on Weight Watchers. Return in 6 months or as needed

## 2014-05-10 ENCOUNTER — Encounter: Payer: Self-pay | Admitting: Obstetrics & Gynecology

## 2014-05-10 ENCOUNTER — Ambulatory Visit (INDEPENDENT_AMBULATORY_CARE_PROVIDER_SITE_OTHER): Payer: No Typology Code available for payment source | Admitting: Obstetrics & Gynecology

## 2014-05-10 VITALS — BP 100/62 | HR 60 | Resp 16 | Ht 62.5 in | Wt 179.2 lb

## 2014-05-10 DIAGNOSIS — Z01419 Encounter for gynecological examination (general) (routine) without abnormal findings: Secondary | ICD-10-CM

## 2014-05-10 NOTE — Progress Notes (Signed)
59 y.o. [id number] MarriedCaucasianF here for annual exam.  Doing well.  No vaginal bleeding.  Lost 15# with Weight watcher.  Doing Hospice now.  Very happy with change in pace of work.  Saw Dr. Renold Genta 04/25/14.  Taking 1/2 tab of Hyzaar.  D/W pt BP today.  She is going to stop this completely.    Patient's last menstrual period was 06/01/2013.          Sexually active: Yes.    The current method of family planning is IUD.    Exercising: Yes.    walking Smoker:  no  Health Maintenance: Pap:  04/27/13 WNL History of abnormal Pap:  no MMG:  05/16/13 3D-normal Colonoscopy:  2008-repeat in 10 years BMD:   2010 TDaP:  2008 Screening Labs: PCP, Hb today: PCP, Urine today: PCP   reports that she has never smoked. She has never used smokeless tobacco. She reports that she does not drink alcohol or use illicit drugs.  Past Medical History  Diagnosis Date  . HTN (hypertension)   . Asthma   . Dyslipidemia   . Hypothyroidism   . GERD (gastroesophageal reflux disease)   . Anxiety     Past Surgical History  Procedure Laterality Date  . Cardiac catheterization    . Cholecystectomy    . Breast surgery Left 2000    [place]  . Diagnostic laparoscopy    . Dilation and curettage of uterus  04/12/2011    Procedure: DILATATION AND CURETTAGE;  Surgeon: Megan Salon, MD;  Location: [facility];  Service: Gynecology;  Laterality: N/A;  . Intrauterine device insertion  04/19/11    Mirena    Current Outpatient Prescriptions  Medication Sig Dispense Refill  . acetaminophen (TYLENOL) 500 MG tablet Take 1,000 mg by mouth as needed. For pain     . aspirin 81 MG EC tablet Take 81 mg by mouth daily. Stopped taking on 04/07/11 in preparation for surgery.    . cholecalciferol (VITAMIN D) 1000 UNITS tablet Take 1,000 Units by mouth daily.    . citalopram (CELEXA) 20 MG tablet Take 1 tablet (20 mg total) by mouth daily. (Patient taking differently: Take 10 mg by mouth daily. ) 180 tablet 3  . fexofenadine (ALLEGRA) 180  MG tablet Take 180 mg by mouth daily.    . Fluticasone-Salmeterol (ADVAIR DISKUS) 250-50 MCG/DOSE AEPB Inhale 1 puff into the lungs 2 (two) times daily. 60 each 6  . HYDROcodone-acetaminophen (NORCO) 5-325 MG per tablet Take 1 tablet by mouth every 6 (six) hours as needed for moderate pain. 60 tablet 0  . ibuprofen (ADVIL,MOTRIN) 200 MG tablet Take 400 mg by mouth every 6 (six) hours as needed. For pain    . lansoprazole (PREVACID) 30 MG capsule Take 15 mg by mouth daily.     Marland Kitchen levonorgestrel (MIRENA) 20 MCG/24HR IUD 1 each by Intrauterine route once.    Marland Kitchen levothyroxine (SYNTHROID, LEVOTHROID) 75 MCG tablet Take 1 tablet (75 mcg total) by mouth daily. 90 tablet 3  . losartan-hydrochlorothiazide (HYZAAR) 50-12.5 MG per tablet Take 1 tablet by mouth daily. 30 tablet 11  . Multiple Vitamins-Minerals (MULTIVITAMIN PO) Take by mouth.    . pravastatin (PRAVACHOL) 40 MG tablet Take 1 tablet (40 mg total) by mouth at bedtime. 90 tablet 3   No current facility-administered medications for this visit.    Family History  Problem Relation Age of Onset  . Coronary artery disease Father   . COPD Father   . Cancer Father  Bladder  . Hyperlipidemia Mother   . Hypertension Mother     ROS:  Pertinent items are noted in HPI.  Otherwise, a comprehensive ROS was negative.  Exam:   LMP 06/01/2013  Weight change:-15#      Ht Readings from Last 3 Encounters:  04/25/14 5' 2.5" (1.588 m)  10/25/13 5\' 2"  (1.575 m)  04/27/13 5' 2.5" (1.588 m)    General appearance: alert, cooperative and appears stated age Head: Normocephalic, without obvious abnormality, atraumatic Neck: no adenopathy, supple, symmetrical, trachea midline and thyroid normal to inspection and palpation Lungs: clear to auscultation bilaterally Breasts: normal appearance, no masses or tenderness Heart: regular rate and rhythm Abdomen: soft, non-tender; bowel sounds normal; no masses,  no organomegaly Extremities: extremities normal,  atraumatic, no cyanosis or edema Skin: Skin color, texture, turgor normal. No rashes or lesions Lymph nodes: Cervical, supraclavicular, and axillary nodes normal. No abnormal inguinal nodes palpated Neurologic: Grossly normal   Pelvic: External genitalia:  no lesions              Urethra:  normal appearing urethra with no masses, tenderness or lesions              Bartholins and Skenes: normal                 Vagina: normal appearing vagina with normal color and discharge, no lesions              Cervix: no lesions              Pap taken: No. Bimanual Exam:  Uterus:  normal size, contour, position, consistency, mobility, non-tender              Adnexa: normal adnexa and no mass, fullness, tenderness               Rectovaginal: Confirms               Anus:  normal sphincter tone, no lesions  Chaperone was present for exam.  A:  Well Woman with normal exam Elevated lipids and on medication Hypertension Pelvic relaxation Hypothyroidism  H/O simple hyperplasia without atypic 2/13. F/U bx 9/13 neg. IUD placed 3/13. Due out 3/18. Hypotension.  Now on decreased BP medication  P: Mammogram yearly pap smear 2015.  No pap today.   Labs with Dr. Renold Genta IUD for hyperplasia  return annually or prn

## 2014-06-10 ENCOUNTER — Telehealth: Payer: Self-pay

## 2014-06-10 NOTE — Telephone Encounter (Signed)
Pt returning call

## 2014-06-10 NOTE — Telephone Encounter (Signed)
lmtcb-to discuss [facility] BMD//kn

## 2014-06-12 NOTE — Telephone Encounter (Signed)
Pt given results.  Report sent to scan.

## 2014-09-16 ENCOUNTER — Other Ambulatory Visit: Payer: Self-pay | Admitting: *Deleted

## 2014-09-16 MED ORDER — LEVOTHYROXINE SODIUM 75 MCG PO TABS: 75.0000 ug | ORAL_TABLET | Freq: Every day | ORAL | Status: DC

## 2014-09-16 NOTE — Telephone Encounter (Signed)
Levothyroxine refilled

## 2014-10-22 ENCOUNTER — Other Ambulatory Visit: Payer: No Typology Code available for payment source | Admitting: Internal Medicine

## 2014-10-22 ENCOUNTER — Other Ambulatory Visit: Payer: Self-pay | Admitting: Internal Medicine

## 2014-10-23 LAB — LIPID PANEL
CHOL/HDL RATIO: 1.9 ratio (ref <=5.0)
Cholesterol: 116 mg/dL — ABNORMAL LOW (ref 125–200)
HDL: 60 mg/dL (ref >=46)
LDL Cholesterol: 45 mg/dL (ref <130)
Triglycerides: 55 mg/dL (ref <150)
VLDL: 11 mg/dL (ref <30)

## 2014-10-23 LAB — HEPATIC FUNCTION PANEL
ALBUMIN: 3.9 g/dL (ref 3.6–5.1)
ALT: 14 U/L (ref 6–29)
AST: 18 U/L (ref 10–35)
Alkaline Phosphatase: 64 U/L (ref 33–130)
BILIRUBIN TOTAL: 0.8 mg/dL (ref 0.2–1.2)
Bilirubin, Direct: 0.2 mg/dL (ref <=0.2)
Indirect Bilirubin: 0.6 mg/dL (ref 0.2–1.2)
Total Protein: 6.5 g/dL (ref 6.1–8.1)

## 2014-10-24 ENCOUNTER — Encounter: Payer: Self-pay | Admitting: Internal Medicine

## 2014-10-24 ENCOUNTER — Ambulatory Visit (INDEPENDENT_AMBULATORY_CARE_PROVIDER_SITE_OTHER): Payer: No Typology Code available for payment source | Admitting: Internal Medicine

## 2014-10-24 VITALS — BP 104/64 | HR 83 | Temp 97.9°F | Wt 181.0 lb

## 2014-10-24 DIAGNOSIS — E785 Hyperlipidemia, unspecified: Secondary | ICD-10-CM

## 2014-10-24 DIAGNOSIS — E039 Hypothyroidism, unspecified: Secondary | ICD-10-CM

## 2014-10-24 DIAGNOSIS — I10 Essential (primary) hypertension: Secondary | ICD-10-CM

## 2014-10-24 DIAGNOSIS — Z23 Encounter for immunization: Secondary | ICD-10-CM

## 2014-10-26 NOTE — Patient Instructions (Addendum)
Continue same medications as previously prescribed. Return in 6 months for physical exam. Flu vaccine given. It was pleasure to see you today.

## 2014-10-26 NOTE — Progress Notes (Signed)
   Subjective:    Patient ID: Donna Reynolds, female    DOB: [birth year], 59 y.o.   MRN: [id number]  HPI 59 year old Female in today for follow-up of essential hypertension and hyperlipidemia. Lipid panel and liver functions are normal. Blood pressures under excellent control on current regimen. No new complaints or problems. History of hypothyroidism. TSH checked in March and was within normal limits.    Review of Systems     Objective:   Physical Exam  Skin warm and dry. Nodes none. Neck is supple without JVD thyromegaly or carotid bruits. Chest clear to auscultation. Cardiac exam regular rate and rhythm. Extremities without edema.      Assessment & Plan:  Essential hypertension-stable on current regimen  Hyperlipidemia-stable on statin medication. Normal lipid panel and liver functions  Hypothyroidism-continue thyroid replacement therapy. Check TSH at time of physical exam Spring 2017  Health maintenance-flu vaccine given  Plan: Return in 6 months for physical examination. Continue current medications as previously prescribed.

## 2014-12-16 ENCOUNTER — Other Ambulatory Visit: Payer: Self-pay

## 2014-12-16 MED ORDER — PRAVASTATIN SODIUM 40 MG PO TABS: 40.0000 mg | ORAL_TABLET | Freq: Every day | ORAL | Status: DC

## 2014-12-16 MED ORDER — LOSARTAN POTASSIUM-HCTZ 50-12.5 MG PO TABS: 1.0000 | ORAL_TABLET | Freq: Every day | ORAL | Status: DC

## 2015-03-11 ENCOUNTER — Ambulatory Visit (INDEPENDENT_AMBULATORY_CARE_PROVIDER_SITE_OTHER): Payer: Managed Care, Other (non HMO) | Admitting: Internal Medicine

## 2015-03-11 DIAGNOSIS — Z23 Encounter for immunization: Secondary | ICD-10-CM | POA: Diagnosis not present

## 2015-03-11 NOTE — Patient Instructions (Signed)
Return in 6 months for second hep A immunization

## 2015-03-11 NOTE — Progress Notes (Signed)
   Subjective:    Patient ID: Donna Reynolds, female    DOB: [birth year], 60 y.o.   MRN: [id number]  HPI Here today for first hepatitis A immunization for trip to [place] later this year. Will need second immunization in 6 months prior to her departure.    Review of Systems     Objective:   Physical Exam  Not examined. Hepatitis A immunization administered by CMA      Assessment & Plan:  Need for hepatitis A prophylaxis  Plan: Return in 6 months for second immunization

## 2015-04-14 ENCOUNTER — Other Ambulatory Visit: Payer: Self-pay

## 2015-04-14 MED ORDER — CITALOPRAM HYDROBROMIDE 20 MG PO TABS: 20.0000 mg | ORAL_TABLET | Freq: Every day | ORAL | Status: DC

## 2015-04-25 ENCOUNTER — Other Ambulatory Visit: Payer: Managed Care, Other (non HMO) | Admitting: Internal Medicine

## 2015-04-25 DIAGNOSIS — E785 Hyperlipidemia, unspecified: Secondary | ICD-10-CM

## 2015-04-25 DIAGNOSIS — Z130 Encounter for screening for diseases of the blood and blood-forming organs and certain disorders involving the immune mechanism: Secondary | ICD-10-CM

## 2015-04-25 DIAGNOSIS — E559 Vitamin D deficiency, unspecified: Secondary | ICD-10-CM

## 2015-04-25 DIAGNOSIS — E039 Hypothyroidism, unspecified: Secondary | ICD-10-CM

## 2015-04-25 DIAGNOSIS — I10 Essential (primary) hypertension: Secondary | ICD-10-CM

## 2015-04-25 DIAGNOSIS — Z0000 Encounter for general adult medical examination without abnormal findings: Secondary | ICD-10-CM

## 2015-04-25 LAB — CBC WITH DIFFERENTIAL/PLATELET
BASOS ABS: 0.1 10*3/uL (ref 0.0–0.1)
Basophils Relative: 1 % (ref 0–1)
EOS ABS: 0.3 10*3/uL (ref 0.0–0.7)
Eosinophils Relative: 4 % (ref 0–5)
HCT: 41.2 % (ref 36.0–46.0)
Hemoglobin: 13.7 g/dL (ref 12.0–15.0)
LYMPHS ABS: 2.3 10*3/uL (ref 0.7–4.0)
LYMPHS PCT: 31 % (ref 12–46)
MCH: 31.1 pg (ref 26.0–34.0)
MCHC: 33.3 g/dL (ref 30.0–36.0)
MCV: 93.6 fL (ref 78.0–100.0)
MPV: 11 fL (ref 8.6–12.4)
Monocytes Absolute: 0.7 10*3/uL (ref 0.1–1.0)
Monocytes Relative: 10 % (ref 3–12)
NEUTROS PCT: 54 % (ref 43–77)
Neutro Abs: 4 10*3/uL (ref 1.7–7.7)
PLATELETS: 243 10*3/uL (ref 150–400)
RBC: 4.4 MIL/uL (ref 3.87–5.11)
RDW: 13.9 % (ref 11.5–15.5)
WBC: 7.4 10*3/uL (ref 4.0–10.5)

## 2015-04-25 LAB — TSH: TSH: 0.94 m[IU]/L

## 2015-04-26 LAB — VITAMIN D 25 HYDROXY (VIT D DEFICIENCY, FRACTURES): VIT D 25 HYDROXY: 43 ng/mL (ref 30–100)

## 2015-04-30 LAB — COMPLETE METABOLIC PANEL WITH GFR
ALT: 10 U/L (ref 6–29)
AST: 14 U/L (ref 10–35)
Albumin: 3.7 g/dL (ref 3.6–5.1)
Alkaline Phosphatase: 65 U/L (ref 33–130)
BILIRUBIN TOTAL: 0.7 mg/dL (ref 0.2–1.2)
BUN: 18 mg/dL (ref 7–25)
CO2: 28 mmol/L (ref 20–31)
CREATININE: 0.82 mg/dL (ref 0.50–1.05)
Calcium: 9 mg/dL (ref 8.6–10.4)
Chloride: 102 mmol/L (ref 98–110)
GFR, Est Non African American: 79 mL/min (ref >=60)
GLUCOSE: 82 mg/dL (ref 65–99)
Potassium: 3.9 mmol/L (ref 3.5–5.3)
Sodium: 142 mmol/L (ref 135–146)
TOTAL PROTEIN: 6.5 g/dL (ref 6.1–8.1)

## 2015-04-30 LAB — LIPID PANEL
Cholesterol: 112 mg/dL — ABNORMAL LOW (ref 125–200)
HDL: 60 mg/dL (ref >=46)
LDL CALC: 43 mg/dL (ref <130)
TRIGLYCERIDES: 46 mg/dL (ref <150)
Total CHOL/HDL Ratio: 1.9 Ratio (ref <=5.0)
VLDL: 9 mg/dL (ref <30)

## 2015-05-01 ENCOUNTER — Encounter: Payer: No Typology Code available for payment source | Admitting: Internal Medicine

## 2015-05-23 ENCOUNTER — Encounter: Payer: Managed Care, Other (non HMO) | Admitting: Internal Medicine

## 2015-06-10 ENCOUNTER — Ambulatory Visit (INDEPENDENT_AMBULATORY_CARE_PROVIDER_SITE_OTHER): Payer: Managed Care, Other (non HMO) | Admitting: Internal Medicine

## 2015-06-10 ENCOUNTER — Encounter: Payer: Self-pay | Admitting: Internal Medicine

## 2015-06-10 VITALS — BP 106/78 | HR 80 | Temp 98.5°F | Resp 18 | Ht 63.0 in | Wt 185.5 lb

## 2015-06-10 DIAGNOSIS — J309 Allergic rhinitis, unspecified: Secondary | ICD-10-CM | POA: Diagnosis not present

## 2015-06-10 DIAGNOSIS — K219 Gastro-esophageal reflux disease without esophagitis: Secondary | ICD-10-CM | POA: Diagnosis not present

## 2015-06-10 DIAGNOSIS — E785 Hyperlipidemia, unspecified: Secondary | ICD-10-CM

## 2015-06-10 DIAGNOSIS — Z0000 Encounter for general adult medical examination without abnormal findings: Secondary | ICD-10-CM | POA: Diagnosis not present

## 2015-06-10 DIAGNOSIS — Z8709 Personal history of other diseases of the respiratory system: Secondary | ICD-10-CM | POA: Diagnosis not present

## 2015-06-10 DIAGNOSIS — E039 Hypothyroidism, unspecified: Secondary | ICD-10-CM

## 2015-06-10 DIAGNOSIS — I10 Essential (primary) hypertension: Secondary | ICD-10-CM | POA: Diagnosis not present

## 2015-06-10 NOTE — Progress Notes (Signed)
Subjective:    Patient ID: Donna Reynolds, female    DOB: 1955/11/06, 60 y.o.   MRN: [id number]  HPI 60 year old Female for health maintenance and evaluations of medical issues.  Dr. Carron Brazen GYN physician. She was seen by Dr. Sabra Heck May 2015 with an episode of postmenopausal bleeding. Had ultrasound showing new left ovarian cyst which appeared to be benign. History of endometrial hyperplasia without atypia treated March 2013 with D&C followed by Mirena IUD with repeat biopsy done September 2013 which was negative. No further episodes of bleeding.  She has a history of essential hypertension, hyperlipidemia, allergic rhinitis, asthma, GE reflux, hypothyroidism.  She was allergy tested by Dr. Donneta Romberg in the past and was found to have positive skin test to house dust, dust mites, cockroach, mold mix, slight reaction to grass. Not positive to trees, dog, cat or feathers. Selective food testing was negative. Spirometry was normal.  In 2011 she had sciatica right leg for which tramadol was prescribed. Occasionally has dependent edema for which she takes when necessary Lasix.  History of benign breast biopsy in the past.  Cardiology evaluation February 2012 for chest pain-had negative cardiac catheterization.  Patient had endoscopy and colonoscopy by Dr. Collene Mares in 2009. There was an isolated diverticulum in her left colon otherwise normal. Had endoscopy at the time of colonoscopy because of melena. She had been taking a lot of ibuprofen for dental problems. Endoscopy showed some gastric polyps.  History of shingles 2006  History of left lower lobe pneumonia August 2006. Had Pneumovax in 1998. History of diverticulitis in 2001.  Positive measles titer 1989.  Laparoscopic cholecystectomy March 1996.  History of mononucleosis in the 1980s. History of hospitalization for chickenpox in the 10s.  History of migraine headaches. MRI in 2004 showed an absent right vertebral basilar artery  which is congenital. 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 His tried Topamax in the past per Dr. Jannifer Franklin. 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  Social history: She is married to a Software engineer. One adult daughter is married and now lives in [place]. Patient is a native of [place]. She attended [employer] of medicine. Her parents immigrated from [place]. She is an only child. Nonsmoker. Social alcohol consumption. 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  Family history: Father died of complications of congestive heart failure and had history of chronic kidney disease. He also had history of bladder cancer. Mother with history of hypertension and hyperlipidemia doing well.   Patient has gained 6-1/2 pounds since last physical exam March 2016.      Review of Systems     Objective:   Physical Exam  Constitutional: She is oriented to person, place, and time. She appears well-developed and well-nourished. No distress.  HENT:  Head: Normocephalic and atraumatic.  Right Ear: External ear normal.  Left Ear: External ear normal.  Mouth/Throat: Oropharynx is clear and moist. No oropharyngeal exudate.  Eyes: Conjunctivae and EOM are normal. Pupils are equal, round, and reactive to light. Right eye exhibits no discharge. Left eye exhibits no discharge.  Neck: Neck supple. No JVD present. No thyromegaly present.  Cardiovascular: Normal rate, regular rhythm, normal heart sounds and intact distal pulses.   No murmur heard. Pulmonary/Chest: Effort normal and breath sounds normal. She has no wheezes. She has no rales.  Breasts normal female without masses  Abdominal: Soft. Bowel  sounds are normal. She exhibits no distension and no mass. There is no tenderness. There is no rebound and no guarding.  Genitourinary:  See GYN note per Dr. Sabra Heck 2016    Musculoskeletal: Normal range of motion. She exhibits no edema.  Lymphadenopathy:    She has no cervical adenopathy.  Neurological: She is alert and oriented to person, place, and time. She has normal reflexes. No cranial nerve deficit. Coordination normal.  Skin: Skin is warm and dry. No rash noted. She is not diaphoretic.  Psychiatric: She has a normal mood and affect. Her behavior is normal. Judgment and thought content normal.  Vitals reviewed.         Assessment & Plan:   Normal health maintenance exam  Hyperlipidemia-normal on statin  History of asthma  History of allergic rhinitis  Essential hypertension-stable on current regimen  Hypothyroidism-TSH stable on current dose of thyroid replacement  GE reflux stable with PPI  Plan: Return in 6 months or as needed.

## 2015-07-02 NOTE — Patient Instructions (Signed)
It was a pleasure to see you today.  Continue same medications and return in 6 months. 

## 2015-07-25 ENCOUNTER — Ambulatory Visit (INDEPENDENT_AMBULATORY_CARE_PROVIDER_SITE_OTHER): Payer: Managed Care, Other (non HMO) | Admitting: Obstetrics & Gynecology

## 2015-07-25 ENCOUNTER — Encounter: Payer: Self-pay | Admitting: Obstetrics & Gynecology

## 2015-07-25 VITALS — BP 118/76 | HR 76 | Resp 16 | Ht 62.25 in | Wt 184.8 lb

## 2015-07-25 DIAGNOSIS — Z01419 Encounter for gynecological examination (general) (routine) without abnormal findings: Secondary | ICD-10-CM | POA: Diagnosis not present

## 2015-07-25 DIAGNOSIS — Z124 Encounter for screening for malignant neoplasm of cervix: Secondary | ICD-10-CM | POA: Diagnosis not present

## 2015-07-25 NOTE — Progress Notes (Signed)
60 y.o. [id number] MarriedCaucasianF here for annual exam.  Doing well.  Denies vaginal bleeding.     No LMP recorded. Patient is not currently having periods (Reason: IUD).          Sexually active: Yes.    The current method of family planning is IUD.    Exercising: Yes.    Walking Smoker:  no  Health Maintenance: Pap:  04/27/13 WNL History of abnormal Pap:  no MMG:  06/03/14-BIRADS2-Benign; [facility]. Scheduled for 08/2015 Colonoscopy:  2008-Dr. Collene Mares BMD:   06/03/14-Osteopenia-[facility] TDaP:  06/06/2007 Pneumonia vaccine(s):  Between 5-10 yrs ago Hep C:  Has given blood recently Screening Labs: PCP, Hb today: PCP, Urine today: PCP   reports that she has never smoked. She has never used smokeless tobacco. She reports that she does not drink alcohol or use illicit drugs.  Past Medical History  Diagnosis Date  . HTN (hypertension)   . Asthma   . Dyslipidemia   . Hypothyroidism   . GERD (gastroesophageal reflux disease)   . Anxiety     Past Surgical History  Procedure Laterality Date  . Cardiac catheterization    . Cholecystectomy    . Breast surgery Left 2000    [facility]  . Diagnostic laparoscopy    . Dilation and curettage of uterus  04/12/2011    Procedure: DILATATION AND CURETTAGE;  Surgeon: Megan Salon, MD;  Location: [facility];  Service: Gynecology;  Laterality: N/A;  . Intrauterine device insertion  04/19/11    Mirena    Current Outpatient Prescriptions  Medication Sig Dispense Refill  . acetaminophen (TYLENOL) 500 MG tablet Take 1,000 mg by mouth as needed. For pain     . aspirin 81 MG EC tablet Take 81 mg by mouth daily. Stopped taking on 04/07/11 in preparation for surgery.    . cholecalciferol (VITAMIN D) 1000 UNITS tablet Take 1,000 Units by mouth daily.    . citalopram (CELEXA) 20 MG tablet Take 1 tablet (20 mg total) by mouth daily. (Patient taking differently: Take 20 mg by mouth daily. Take 1/2 tab daily) 180 tablet 3  . fexofenadine (ALLEGRA) 180 MG tablet Take 180 mg by  mouth daily.    . Fluticasone-Salmeterol (ADVAIR DISKUS) 250-50 MCG/DOSE AEPB Inhale 1 puff into the lungs 2 (two) times daily. 60 each 6  . ibuprofen (ADVIL,MOTRIN) 200 MG tablet Take 400 mg by mouth every 6 (six) hours as needed. For pain    . lansoprazole (PREVACID) 30 MG capsule Take 15 mg by mouth daily.     Marland Kitchen levonorgestrel (MIRENA) 20 MCG/24HR IUD 1 each by Intrauterine route once.    Marland Kitchen levothyroxine (SYNTHROID, LEVOTHROID) 75 MCG tablet Take 1 tablet (75 mcg total) by mouth daily. 90 tablet 1  . losartan-hydrochlorothiazide (HYZAAR) 50-12.5 MG tablet Take 1 tablet by mouth daily. (Patient taking differently: Take 0.5 tablets by mouth daily. Take 1/2 tab daily) 30 tablet 3  . Multiple Vitamins-Minerals (MULTIVITAMIN PO) Take by mouth.    . pravastatin (PRAVACHOL) 40 MG tablet Take 1 tablet (40 mg total) by mouth at bedtime. 90 tablet 3   No current facility-administered medications for this visit.    Family History  Problem Relation Age of Onset  . Coronary artery disease Father   . COPD Father   . Cancer Father     Bladder  . Hyperlipidemia Mother   . Hypertension Mother     ROS:  Pertinent items are noted in HPI.  Otherwise, a comprehensive ROS was negative.  Exam:   BP 118/76 mmHg  Pulse 76  Resp 16  Ht 5' 2.25" (1.581 m)  Wt 184 lb 12.8 oz (83.825 kg)  BMI 33.54 kg/m2  Weight change: +5#   Height: 5' 2.25" (158.1 cm)  Ht Readings from Last 3 Encounters:  07/25/15 5' 2.25" (1.581 m)  06/10/15 5\' 3"  (1.6 m)  05/10/14 5' 2.5" (1.588 m)    General appearance: alert, cooperative and appears stated age Head: Normocephalic, without obvious abnormality, atraumatic Neck: no adenopathy, supple, symmetrical, trachea midline and thyroid normal to inspection and palpation Lungs: clear to auscultation bilaterally Breasts: normal appearance, no masses or tenderness Heart: regular rate and rhythm Abdomen: soft, non-tender; bowel sounds normal; no masses,  no  organomegaly Extremities: extremities normal, atraumatic, no cyanosis or edema Skin: Skin color, texture, turgor normal. No rashes or lesions Lymph nodes: Cervical, supraclavicular, and axillary nodes normal. No abnormal inguinal nodes palpated Neurologic: Grossly normal   Pelvic: External genitalia:  no lesions              Urethra:  normal appearing urethra with no masses, tenderness or lesions              Bartholins and Skenes: normal                 Vagina: normal appearing vagina with normal color and discharge, no lesions, 2nd degree cystocele, second degree uterine prolapse              Cervix: no lesions              Pap taken: Yes.   Bimanual Exam:  Uterus:  normal size, contour, position, consistency, mobility, non-tender              Adnexa: normal adnexa and no mass, fullness, tenderness               Rectovaginal: Confirms               Anus:  normal sphincter tone, no lesions  Chaperone was present for exam.   A: Well Woman with normal exam Hyperlipidemia Hypertension Pelvic relaxation Hypothyroidism  H/O simple hyperplasia without atypic 2/13. F/U bx 9/13 neg. IUD placed 3/13.   P: Mammogram yearly pap smear 2015.  Neg HR HPV 3/13. Pap and HR HPV obtained today. Labs with Dr. Renold Genta.  Has given blood in the last year.  No Hep C testing needed. IUD for hyperplasia .  Plan removal next year.   return annually or prn

## 2015-07-29 LAB — IPS PAP TEST WITH HPV

## 2015-08-18 ENCOUNTER — Telehealth: Payer: Self-pay | Admitting: Internal Medicine

## 2015-08-18 ENCOUNTER — Other Ambulatory Visit: Payer: Self-pay

## 2015-08-18 MED ORDER — PREDNISONE 10 MG PO TABS
ORAL_TABLET | ORAL | Status: DC
Start: 2015-08-18 — End: 2016-12-16

## 2015-08-18 MED ORDER — PRAVASTATIN SODIUM 40 MG PO TABS: 40.0000 mg | ORAL_TABLET | Freq: Every day | ORAL | Status: DC

## 2015-08-18 MED ORDER — ALBUTEROL SULFATE HFA 108 (90 BASE) MCG/ACT IN AERS: 2.0000 | INHALATION_SPRAY | Freq: Four times a day (QID) | RESPIRATORY_TRACT | Status: DC | PRN

## 2015-08-18 NOTE — Telephone Encounter (Signed)
Patient having trouble with asthma. Request albuterol inhaler and prednisone tablets. Call in albuterol inhaler with when necessary one year refills with directions 2 sprays by mouth 4 times a day and prednisone 10 mg tablets #42 to take in tapering dose as directed

## 2015-09-24 ENCOUNTER — Telehealth: Payer: Self-pay | Admitting: Internal Medicine

## 2015-09-24 NOTE — Telephone Encounter (Signed)
Written prescription for Advair 250/50 3 months supply with one-year refill one spray by mouth every 12 hours. Requested today. Husband will pick up prescription.

## 2015-10-08 ENCOUNTER — Encounter: Payer: Self-pay | Admitting: Obstetrics & Gynecology

## 2016-01-16 ENCOUNTER — Telehealth: Payer: Self-pay | Admitting: Internal Medicine

## 2016-01-16 MED ORDER — HYDROCODONE-ACETAMINOPHEN 5-325 MG PO TABS: 1.0000 | ORAL_TABLET | Freq: Four times a day (QID) | ORAL | 0 refills | Status: DC | PRN

## 2016-01-19 NOTE — Telephone Encounter (Signed)
Refill Hydrocodone APA 5/325 for musculoskeletal pain

## 2016-02-06 ENCOUNTER — Other Ambulatory Visit: Payer: Self-pay | Admitting: Internal Medicine

## 2016-02-06 MED ORDER — LEVOTHYROXINE SODIUM 75 MCG PO TABS: 75.0000 ug | ORAL_TABLET | Freq: Every day | ORAL | 2 refills | Status: DC

## 2016-08-02 ENCOUNTER — Telehealth: Payer: Self-pay | Admitting: Internal Medicine

## 2016-08-02 MED ORDER — ALBUTEROL SULFATE HFA 108 (90 BASE) MCG/ACT IN AERS
2.0000 | INHALATION_SPRAY | Freq: Four times a day (QID) | RESPIRATORY_TRACT | 99 refills | Status: DC | PRN
Start: 2016-08-02 — End: 2020-01-18

## 2016-08-02 NOTE — Telephone Encounter (Signed)
Patient had respiratory infection requesting Ventolin inhaler. Have called to [facility] with when necessary one year refills

## 2016-08-05 ENCOUNTER — Telehealth: Payer: Self-pay

## 2016-08-05 MED ORDER — LEVOTHYROXINE SODIUM 75 MCG PO TABS: 75.0000 ug | ORAL_TABLET | Freq: Every day | ORAL | 3 refills | Status: DC

## 2016-08-05 MED ORDER — PRAVASTATIN SODIUM 40 MG PO TABS: 40.0000 mg | ORAL_TABLET | Freq: Every day | ORAL | 3 refills | Status: DC

## 2016-08-05 NOTE — Telephone Encounter (Signed)
Received fax from [facility] in regards to a refill on Levothyroxine 69mcg and Pravastatin 40mg  for patient. Medication was refilled per Dr. Verlene Mayer request. Sent 1 yr for both

## 2016-09-14 ENCOUNTER — Telehealth: Payer: Self-pay | Admitting: Internal Medicine

## 2016-09-14 MED ORDER — FLUTICASONE-SALMETEROL 250-50 MCG/DOSE IN AEPB: 1.0000 | INHALATION_SPRAY | Freq: Two times a day (BID) | RESPIRATORY_TRACT | 11 refills | Status: DC

## 2016-09-14 MED ORDER — FLUTICASONE-SALMETEROL 250-50 MCG/DOSE IN AEPB: 1.0000 | INHALATION_SPRAY | Freq: Two times a day (BID) | RESPIRATORY_TRACT | 3 refills | Status: DC

## 2016-09-14 NOTE — Telephone Encounter (Signed)
Donna Reynolds is calling and asking for a written Rx for Dr. Shelbie Proctor for her Fluticasone-Salmeterol 250-5- Mcg/Dose AEPB.   Directions:  Inhale 1 puff into the lungs 2 times daily.   He asked that we write it for 1 year please.  Please call Donna Reynolds when Rx is ready to pick up.    Best # for contacting Donna Reynolds:  [phone]  Thank you.

## 2016-09-14 NOTE — Telephone Encounter (Signed)
Printed and pt aware  

## 2016-09-14 NOTE — Telephone Encounter (Signed)
Can you print this up and I will sign

## 2016-10-22 ENCOUNTER — Ambulatory Visit (INDEPENDENT_AMBULATORY_CARE_PROVIDER_SITE_OTHER): Payer: Managed Care, Other (non HMO) | Admitting: Obstetrics & Gynecology

## 2016-10-22 ENCOUNTER — Encounter: Payer: Self-pay | Admitting: Obstetrics & Gynecology

## 2016-10-22 VITALS — BP 118/64 | HR 62 | Resp 14 | Ht 62.5 in | Wt 196.0 lb

## 2016-10-22 DIAGNOSIS — Z30432 Encounter for removal of intrauterine contraceptive device: Secondary | ICD-10-CM

## 2016-10-22 DIAGNOSIS — Z01419 Encounter for gynecological examination (general) (routine) without abnormal findings: Secondary | ICD-10-CM | POA: Diagnosis not present

## 2016-10-22 NOTE — Progress Notes (Signed)
61 y.o. [id number] Married Caucasian F here for annual exam.    No LMP recorded. Patient is not currently having periods (Reason: IUD).          Sexually active: No.  The current method of family planning is post menopausal status.    Exercising: Yes.    walking Smoker:  no  Health Maintenance: Pap:  07/25/15 negative, HR HPV negative, 04/27/13 negative  History of abnormal Pap:  no MMG:  09/26/15 BIRADS 2 benign  Colonoscopy:  2008- Dr. Collene Mares.  Aware that it is due.   BMD:   06/03/14 osteopenia  TDaP:  06/06/07.  Will do this in November Pneumonia vaccine(s): PCP Zostavax:   never Hep C testing: years ago  Screening Labs: PCP, Hb today: PCP   reports that she has never smoked. She has never used smokeless tobacco. She reports that she does not drink alcohol or use drugs.  Past Medical History:  Diagnosis Date  . Anxiety   . Asthma   . Dyslipidemia   . GERD (gastroesophageal reflux disease)   . HTN (hypertension)   . Hypothyroidism     Past Surgical History:  Procedure Laterality Date  . BREAST SURGERY Left 2000   [facility]  . CARDIAC CATHETERIZATION    . CHOLECYSTECTOMY    . DIAGNOSTIC LAPAROSCOPY    . DILATION AND CURETTAGE OF UTERUS  04/12/2011   Procedure: DILATATION AND CURETTAGE;  Surgeon: Megan Salon, MD;  Location: [facility];  Service: Gynecology;  Laterality: N/A;  . INTRAUTERINE DEVICE INSERTION  04/19/11   Mirena    Current Outpatient Prescriptions  Medication Sig Dispense Refill  . acetaminophen (TYLENOL) 500 MG tablet Take 1,000 mg by mouth as needed. For pain     . albuterol (PROVENTIL HFA;VENTOLIN HFA) 108 (90 Base) MCG/ACT inhaler Inhale 2 puffs into the lungs every 6 (six) hours as needed for wheezing or shortness of breath. 1 Inhaler prn  . aspirin 81 MG EC tablet Take 81 mg by mouth daily. Stopped taking on 04/07/11 in preparation for surgery.    . cholecalciferol (VITAMIN D) 1000 UNITS tablet Take 1,000 Units by mouth daily.    . citalopram (CELEXA) 20 MG tablet  Take 1 tablet (20 mg total) by mouth daily. (Patient taking differently: Take 20 mg by mouth daily. Take 1/2 tab daily) 180 tablet 3  . fexofenadine (ALLEGRA) 180 MG tablet Take 180 mg by mouth daily.    . Fluticasone-Salmeterol (ADVAIR DISKUS) 250-50 MCG/DOSE AEPB Inhale 1 puff into the lungs 2 (two) times daily. 180 each 3  . HYDROcodone-acetaminophen (NORCO) 5-325 MG tablet Take 1 tablet by mouth every 6 (six) hours as needed for moderate pain. 60 tablet 0  . lansoprazole (PREVACID) 30 MG capsule Take 15 mg by mouth daily.     Marland Kitchen levonorgestrel (MIRENA) 20 MCG/24HR IUD 1 each by Intrauterine route once.    Marland Kitchen levothyroxine (SYNTHROID, LEVOTHROID) 75 MCG tablet Take 1 tablet (75 mcg total) by mouth daily. 90 tablet 3  . losartan-hydrochlorothiazide (HYZAAR) 50-12.5 MG tablet Take 1 tablet by mouth daily. (Patient taking differently: Take 0.5 tablets by mouth daily. Take 1/2 tab daily) 30 tablet 3  . Multiple Vitamins-Minerals (MULTIVITAMIN PO) Take by mouth.    . pravastatin (PRAVACHOL) 40 MG tablet Take 1 tablet (40 mg total) by mouth at bedtime. 90 tablet 3  . predniSONE (DELTASONE) 10 MG tablet Take in tapering course as directed for asthma 42 tablet 0   No current facility-administered medications for this  visit.     Family History  Problem Relation Age of Onset  . Coronary artery disease Father   . COPD Father   . Cancer Father        Bladder  . Hyperlipidemia Mother   . Hypertension Mother     ROS:  Pertinent items are noted in HPI.  Otherwise, a comprehensive ROS was negative.  Exam:   BP 118/64 (BP Location: Right Arm, Patient Position: Sitting, Cuff Size: Normal)   Pulse 62   Resp 14   Ht 5' 2.5" (1.588 m)   Wt 196 lb (88.9 kg)   BMI 35.28 kg/m    Height: 5' 2.5" (158.8 cm)  Ht Readings from Last 3 Encounters:  10/22/16 5' 2.5" (1.588 m)  07/25/15 5' 2.25" (1.581 m)  06/10/15 5\' 3"  (1.6 m)    General appearance: alert, cooperative and appears stated age Head:  Normocephalic, without obvious abnormality, atraumatic Neck: no adenopathy, supple, symmetrical, trachea midline and thyroid normal to inspection and palpation Lungs: clear to auscultation bilaterally Breasts: normal appearance, no masses or tenderness Heart: regular rate and rhythm Abdomen: soft, non-tender; bowel sounds normal; no masses,  no organomegaly Extremities: extremities normal, atraumatic, no cyanosis or edema Skin: Skin color, texture, turgor normal. No rashes or lesions Lymph nodes: Cervical, supraclavicular, and axillary nodes normal. No abnormal inguinal nodes palpated Neurologic: Grossly normal   Pelvic: External genitalia:  no lesions              Urethra:  normal appearing urethra with no masses, tenderness or lesions              Bartholins and Skenes: normal                 Vagina: normal appearing vagina with normal color and discharge, no lesions              Cervix: no lesions              Pap taken: No. Bimanual Exam:  Uterus:  normal size, contour, position, consistency, mobility, non-tender              Adnexa: normal adnexa               Rectovaginal: Confirms               Anus:  normal sphincter tone, no lesions  Procedure:  IUD strings grasped and with one pull IUD was removed without difficulty.  Pt tolerated procedure well.    Chaperone was present for exam.  A:  Well Woman with normal exam Hyperlipidemia Hypertension H/O pelvic relaxation Hypothyroidism H/O simple endometrial hyperplasia 2/13.  Follow-up biopsy 9/13 neg.  IUD placed 3/13.  Due for removal.   P:   Mammogram guidelines reviewed pap smear and HR HPV 2017.  No pap smear obtained today Will do blood work with Dr. Renold Genta in November. Planning on doing Shingrix vaccination Knows her colonoscopy is due.  States she will schedule this.  Declines help. return annually or prn

## 2016-10-23 ENCOUNTER — Telehealth: Payer: Self-pay | Admitting: Obstetrics & Gynecology

## 2016-10-25 NOTE — Telephone Encounter (Signed)
Opened in error

## 2016-11-12 DIAGNOSIS — Z1231 Encounter for screening mammogram for malignant neoplasm of breast: Secondary | ICD-10-CM | POA: Diagnosis not present

## 2016-12-02 ENCOUNTER — Encounter: Payer: Self-pay | Admitting: Obstetrics & Gynecology

## 2016-12-14 ENCOUNTER — Other Ambulatory Visit: Payer: 59 | Admitting: Internal Medicine

## 2016-12-14 DIAGNOSIS — E039 Hypothyroidism, unspecified: Secondary | ICD-10-CM

## 2016-12-14 DIAGNOSIS — E785 Hyperlipidemia, unspecified: Secondary | ICD-10-CM | POA: Diagnosis not present

## 2016-12-14 DIAGNOSIS — Z0000 Encounter for general adult medical examination without abnormal findings: Secondary | ICD-10-CM | POA: Diagnosis not present

## 2016-12-14 DIAGNOSIS — K219 Gastro-esophageal reflux disease without esophagitis: Secondary | ICD-10-CM

## 2016-12-14 DIAGNOSIS — J309 Allergic rhinitis, unspecified: Secondary | ICD-10-CM

## 2016-12-14 DIAGNOSIS — I10 Essential (primary) hypertension: Secondary | ICD-10-CM

## 2016-12-15 LAB — COMPLETE METABOLIC PANEL WITH GFR
AG RATIO: 1.5 (calc) (ref 1.0–2.5)
ALT: 9 U/L (ref 6–29)
AST: 14 U/L (ref 10–35)
Albumin: 3.9 g/dL (ref 3.6–5.1)
Alkaline phosphatase (APISO): 64 U/L (ref 33–130)
BUN: 15 mg/dL (ref 7–25)
CALCIUM: 9 mg/dL (ref 8.6–10.4)
CO2: 30 mmol/L (ref 20–32)
CREATININE: 0.67 mg/dL (ref 0.50–0.99)
Chloride: 102 mmol/L (ref 98–110)
GFR, EST AFRICAN AMERICAN: 110 mL/min/{1.73_m2} (ref >=60)
GFR, EST NON AFRICAN AMERICAN: 95 mL/min/{1.73_m2} (ref >=60)
GLUCOSE: 90 mg/dL (ref 65–99)
Globulin: 2.6 g/dL (calc) (ref 1.9–3.7)
Potassium: 3.6 mmol/L (ref 3.5–5.3)
Sodium: 140 mmol/L (ref 135–146)
TOTAL PROTEIN: 6.5 g/dL (ref 6.1–8.1)
Total Bilirubin: 0.5 mg/dL (ref 0.2–1.2)

## 2016-12-15 LAB — LIPID PANEL
CHOL/HDL RATIO: 2 (calc) (ref <5.0)
Cholesterol: 134 mg/dL (ref <200)
HDL: 66 mg/dL (ref >50)
LDL CHOLESTEROL (CALC): 54 mg/dL
NON-HDL CHOLESTEROL (CALC): 68 mg/dL (ref <130)
TRIGLYCERIDES: 59 mg/dL (ref <150)

## 2016-12-15 LAB — CBC WITH DIFFERENTIAL/PLATELET
Basophils Absolute: 78 cells/uL (ref 0–200)
Basophils Relative: 1.2 %
Eosinophils Absolute: 280 cells/uL (ref 15–500)
Eosinophils Relative: 4.3 %
HCT: 40.6 % (ref 35.0–45.0)
Hemoglobin: 13.5 g/dL (ref 11.7–15.5)
Lymphs Abs: 2106 cells/uL (ref 850–3900)
MCH: 30.6 pg (ref 27.0–33.0)
MCHC: 33.3 g/dL (ref 32.0–36.0)
MCV: 92.1 fL (ref 80.0–100.0)
MONOS PCT: 11.4 %
MPV: 10.9 fL (ref 7.5–12.5)
Neutro Abs: 3296 cells/uL (ref 1500–7800)
Neutrophils Relative %: 50.7 %
PLATELETS: 236 10*3/uL (ref 140–400)
RBC: 4.41 10*6/uL (ref 3.80–5.10)
RDW: 12.6 % (ref 11.0–15.0)
TOTAL LYMPHOCYTE: 32.4 %
WBC mixed population: 741 cells/uL (ref 200–950)
WBC: 6.5 10*3/uL (ref 3.8–10.8)

## 2016-12-15 LAB — VITAMIN D 25 HYDROXY (VIT D DEFICIENCY, FRACTURES): Vit D, 25-Hydroxy: 46 ng/mL (ref 30–100)

## 2016-12-15 LAB — TSH: TSH: 0.93 m[IU]/L (ref 0.40–4.50)

## 2016-12-16 ENCOUNTER — Ambulatory Visit (INDEPENDENT_AMBULATORY_CARE_PROVIDER_SITE_OTHER): Payer: 59 | Admitting: Internal Medicine

## 2016-12-16 VITALS — BP 98/60 | HR 98 | Temp 98.5°F | Ht 62.0 in | Wt 198.0 lb

## 2016-12-16 DIAGNOSIS — Z0000 Encounter for general adult medical examination without abnormal findings: Secondary | ICD-10-CM | POA: Diagnosis not present

## 2016-12-16 DIAGNOSIS — K219 Gastro-esophageal reflux disease without esophagitis: Secondary | ICD-10-CM | POA: Diagnosis not present

## 2016-12-16 DIAGNOSIS — Z8709 Personal history of other diseases of the respiratory system: Secondary | ICD-10-CM | POA: Diagnosis not present

## 2016-12-16 DIAGNOSIS — E7849 Other hyperlipidemia: Secondary | ICD-10-CM | POA: Diagnosis not present

## 2016-12-16 DIAGNOSIS — E039 Hypothyroidism, unspecified: Secondary | ICD-10-CM | POA: Diagnosis not present

## 2016-12-16 DIAGNOSIS — I10 Essential (primary) hypertension: Secondary | ICD-10-CM

## 2016-12-16 LAB — POCT URINALYSIS DIPSTICK
BILIRUBIN UA: NEGATIVE
Glucose, UA: NEGATIVE
KETONES UA: NEGATIVE
LEUKOCYTES UA: NEGATIVE
Nitrite, UA: NEGATIVE
Protein, UA: NEGATIVE
RBC UA: NEGATIVE
SPEC GRAV UA: 1.015 (ref 1.010–1.025)
Urobilinogen, UA: 0.2 E.U./dL
pH, UA: 6.5 (ref 5.0–8.0)

## 2016-12-16 MED ORDER — ALPRAZOLAM 0.5 MG PO TABS: 0.5000 mg | ORAL_TABLET | Freq: Three times a day (TID) | ORAL | 1 refills | Status: DC | PRN

## 2016-12-16 NOTE — Progress Notes (Signed)
Error

## 2016-12-30 ENCOUNTER — Encounter: Payer: Self-pay | Admitting: Internal Medicine

## 2016-12-30 NOTE — Progress Notes (Signed)
Subjective:    Patient ID: Donna Reynolds, female    DOB: 28-Jul-1955, 61 y.o.   MRN: [id number]  HPI 61 year old White female in today for health maintenance.  History of hypertension hyperlipidemia allergic rhinitis, asthma, GE reflux and hypothyroidism.  Dr. Sabra Heck is GYN physician.  In May 2015 she had an episode of postmenopausal bleeding.  Ultrasound showed new left ovarian cyst which appeared to be benign.  History of endometrial hyperplasia without atypia treated March 2013 with D&C followed by Mirena IUD with repeat biopsy done in September 2013 which was negative.  No further episodes of bleeding.  Mirena has been removed.  She has a history of allergic rhinitis tested by Dr. Donneta Romberg in the past and was found to have positive skin test to house dust, dust mites, cockroach, mold mix, slight reaction to grass.  Not positive to trees, dog, cat or feathers.  Selective flu testing was negative.  Spirometry was normal.  In 2011 she had sciatica right leg for which tramadol was prescribed.  Occasionally has dependent edema for which she takes Lasix prn.  History of benign breast biopsy in the past.  Cardiology evaluation February 2012 for chest pain including cardiac catheterization was negative.  Patient had endoscopy and colonoscopy by Dr. Collene Mares in 2009.  There was an isolated diverticulum in her left colon otherwise normal.  Had endoscopy at the time of colonoscopy because of melena.  She has been taking a lot of ibuprofen for dental problems.  Endoscopy showed gastric polyps.  History of shingles 2006.  History of left lower lobe pneumonia August 2006.  Had Pneumovax in 1998.  History of diverticulitis 2001.  Positive measles titer 1999.  Laparoscopic cholecystectomy March 1996.  Had mononucleosis in the 1980s.  History of hospitalization for chickenpox in the 61s.  History of migraine headaches.  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  Has tried Topamax in the past per Dr. Jannifer Franklin.  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  Social history: She is married to a Software engineer.  One adult daughter is married and now lives in [place].  Patient is a native [employer] [place].  She attended [employer].  Her parents immigrated from [place].  She is an only child.  Non-smoker.  Social alcohol consumption.  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  Family history: Father died of complications of congestive heart failure had history of chronic kidney disease.  He also has history of bladder cancer.  Mother with history of hypertension and hyperlipidemia doing well.    Review of Systems  Constitutional: Negative.   All other systems reviewed and are negative.      Objective:   Physical Exam  Constitutional: She appears well-developed and well-nourished. No distress.  HENT:  Head: Normocephalic and atraumatic.  Right Ear: External ear normal.  Left Ear: External ear normal.  Mouth/Throat: Oropharynx is clear and moist.  Eyes: Conjunctivae and EOM are normal. Pupils are equal, round, and reactive to light. Right eye exhibits no discharge. Left eye exhibits no discharge. No scleral icterus.  Neck: Neck supple. No JVD present. No thyromegaly present.  Cardiovascular: Normal rate, regular rhythm, normal heart sounds and intact distal pulses.  No murmur heard. Pulmonary/Chest: No respiratory distress. She has no wheezes. She has no rales.  Breasts normal female  Abdominal: Soft. Bowel sounds are normal. She exhibits no distension. There is no tenderness. There is no rebound and no guarding.  Genitourinary:  Genitourinary Comments: Referred to GYN  Musculoskeletal: She exhibits no edema.  Lymphadenopathy:    She has no cervical adenopathy.  Skin: Skin is warm and  dry. No rash noted. She is not diaphoretic.  Psychiatric: She has a normal mood and affect. Her behavior is normal. Judgment and thought content normal.  Vitals reviewed.         Assessment & Plan:  Essential hypertension  Hyperlipidemia on statin medication  History of asthma  History of allergic rhinitis  Hypothyroidism-TSH stable on current dose of thyroid replacement  GE reflux stable on PPI  Plan: Return in 6 months or as needed.  Small quantity of Xanax prescribed for anxiety.

## 2016-12-30 NOTE — Patient Instructions (Addendum)
It was a pleasure to see you today.  Continue same medications and return in 6 months. 

## 2017-01-31 ENCOUNTER — Other Ambulatory Visit: Payer: Self-pay

## 2017-01-31 MED ORDER — LOSARTAN POTASSIUM-HCTZ 50-12.5 MG PO TABS: 1.0000 | ORAL_TABLET | Freq: Every day | ORAL | 11 refills | Status: DC

## 2017-04-04 ENCOUNTER — Telehealth: Payer: Self-pay | Admitting: Internal Medicine

## 2017-04-04 MED ORDER — PRAVASTATIN SODIUM 40 MG PO TABS: 40.0000 mg | ORAL_TABLET | Freq: Every day | ORAL | 3 refills | Status: DC

## 2017-04-04 NOTE — Telephone Encounter (Signed)
Refill Pravachol x one year

## 2017-04-04 NOTE — Telephone Encounter (Signed)
Husband calling to request refill for patient on her Pravachol 40mg .    Pharmacy:  [facility]  Thank you.

## 2017-04-04 NOTE — Telephone Encounter (Signed)
ESCRIBED

## 2017-05-31 ENCOUNTER — Other Ambulatory Visit: Payer: Self-pay | Admitting: Internal Medicine

## 2017-05-31 DIAGNOSIS — E785 Hyperlipidemia, unspecified: Secondary | ICD-10-CM

## 2017-05-31 DIAGNOSIS — I10 Essential (primary) hypertension: Secondary | ICD-10-CM

## 2017-06-14 ENCOUNTER — Other Ambulatory Visit: Payer: 59 | Admitting: Internal Medicine

## 2017-06-14 DIAGNOSIS — E785 Hyperlipidemia, unspecified: Secondary | ICD-10-CM | POA: Diagnosis not present

## 2017-06-14 DIAGNOSIS — I10 Essential (primary) hypertension: Secondary | ICD-10-CM

## 2017-06-14 LAB — HEPATIC FUNCTION PANEL
AG Ratio: 1.3 (calc) (ref 1.0–2.5)
ALT: 11 U/L (ref 6–29)
AST: 13 U/L (ref 10–35)
Albumin: 3.9 g/dL (ref 3.6–5.1)
Alkaline phosphatase (APISO): 74 U/L (ref 33–130)
Bilirubin, Direct: 0.2 mg/dL (ref 0.0–0.2)
Globulin: 2.9 g/dL (ref 1.9–3.7)
Indirect Bilirubin: 0.4 mg/dL (ref 0.2–1.2)
Total Bilirubin: 0.6 mg/dL (ref 0.2–1.2)
Total Protein: 6.8 g/dL (ref 6.1–8.1)

## 2017-06-14 LAB — LIPID PANEL
CHOL/HDL RATIO: 2.3 (calc) (ref <5.0)
Cholesterol: 135 mg/dL (ref <200)
HDL: 59 mg/dL (ref >50)
LDL CHOLESTEROL (CALC): 61 mg/dL
NON-HDL CHOLESTEROL (CALC): 76 mg/dL (ref <130)
Triglycerides: 73 mg/dL (ref <150)

## 2017-06-17 ENCOUNTER — Ambulatory Visit (INDEPENDENT_AMBULATORY_CARE_PROVIDER_SITE_OTHER): Payer: 59 | Admitting: Internal Medicine

## 2017-06-17 ENCOUNTER — Encounter: Payer: Self-pay | Admitting: Internal Medicine

## 2017-06-17 VITALS — BP 130/78 | HR 84 | Ht 62.0 in

## 2017-06-17 DIAGNOSIS — E7849 Other hyperlipidemia: Secondary | ICD-10-CM

## 2017-06-17 DIAGNOSIS — Z8709 Personal history of other diseases of the respiratory system: Secondary | ICD-10-CM

## 2017-06-17 DIAGNOSIS — I10 Essential (primary) hypertension: Secondary | ICD-10-CM | POA: Diagnosis not present

## 2017-06-17 DIAGNOSIS — K219 Gastro-esophageal reflux disease without esophagitis: Secondary | ICD-10-CM

## 2017-06-17 DIAGNOSIS — E039 Hypothyroidism, unspecified: Secondary | ICD-10-CM

## 2017-07-01 NOTE — Patient Instructions (Signed)
It was a pleasure to see you today.  Continue same medications and return in 6 months. 

## 2017-07-01 NOTE — Progress Notes (Signed)
   Subjective:    Patient ID: Donna Reynolds, female    DOB: 09/02/1955, 62 y.o.   MRN: [id number]  HPI 62 year old Female in today for 79-month recheck on hypertension and hyperlipidemia.  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  She has decided to take some time off. History of GE reflux, history of asthma,  hypothyroidism.     Review of Systems see above     Objective:   Physical Exam  Blood pressure is excellent 130/78.  She is clear.  Cardiac exam regular rate and rhythm.      Assessment & Plan:  Essential hypertension-stable on current regimen of losartan HCTZ  History of asthma- stable  Hyperlipidemia-continue Pravachol 40 mg daily    GE reflux treated with Prevacid  Anxiety treated with Celexa and Xanax  Hypothyroidism-continue current dose of thyroid replacement and follow-up in 6 months.  TSH was not checked.  Physical exam due in 6 months.

## 2017-08-29 ENCOUNTER — Other Ambulatory Visit: Payer: Self-pay

## 2017-08-29 MED ORDER — LEVOTHYROXINE SODIUM 75 MCG PO TABS: 75.0000 ug | ORAL_TABLET | Freq: Every day | ORAL | 1 refills | Status: DC

## 2017-09-16 ENCOUNTER — Ambulatory Visit (INDEPENDENT_AMBULATORY_CARE_PROVIDER_SITE_OTHER): Payer: 59 | Admitting: Internal Medicine

## 2017-09-16 VITALS — BP 100/70 | HR 64 | Temp 98.2°F | Ht 62.0 in | Wt 198.0 lb

## 2017-09-16 DIAGNOSIS — Z23 Encounter for immunization: Secondary | ICD-10-CM | POA: Diagnosis not present

## 2017-09-19 NOTE — Patient Instructions (Signed)
Tdap given.  

## 2017-09-19 NOTE — Progress Notes (Signed)
Tdap given by CMA 

## 2017-10-17 ENCOUNTER — Telehealth: Payer: Self-pay | Admitting: Internal Medicine

## 2017-10-17 NOTE — Telephone Encounter (Signed)
Dr. Shelbie Proctor is asking for a written Rx for her Advair 250.  She is almost out.  She needs to send the Rx to [facility] for her refill.    And, she needs the refill to say [facility] please.  She will come later on today to pick it up.  Thank you.

## 2017-10-17 NOTE — Telephone Encounter (Signed)
Patient came to pick up the Rx.  Thank you.

## 2017-10-17 NOTE — Telephone Encounter (Signed)
Rx written.

## 2017-10-27 ENCOUNTER — Ambulatory Visit (INDEPENDENT_AMBULATORY_CARE_PROVIDER_SITE_OTHER): Payer: 59 | Admitting: Internal Medicine

## 2017-10-27 ENCOUNTER — Telehealth: Payer: Self-pay | Admitting: Internal Medicine

## 2017-10-27 DIAGNOSIS — Z23 Encounter for immunization: Secondary | ICD-10-CM

## 2017-10-27 MED ORDER — HYDROCODONE-ACETAMINOPHEN 5-325 MG PO TABS: 1.0000 | ORAL_TABLET | Freq: Four times a day (QID) | ORAL | 0 refills | Status: DC | PRN

## 2017-10-27 NOTE — Telephone Encounter (Signed)
Refill Hydrocodone APAP 5/325 #60 to [facility]

## 2017-10-27 NOTE — Patient Instructions (Signed)
Flu vaccine given.

## 2017-10-27 NOTE — Progress Notes (Signed)
Flu vaccine given by CMA 

## 2017-11-25 ENCOUNTER — Other Ambulatory Visit (HOSPITAL_COMMUNITY)
Admission: RE | Admit: 2017-11-25 | Discharge: 2017-11-25 | Disposition: A | Discharge status: Home / Self Care | Payer: 59 | Source: Ambulatory Visit | Attending: Obstetrics & Gynecology | Admitting: Obstetrics & Gynecology

## 2017-11-25 ENCOUNTER — Ambulatory Visit (INDEPENDENT_AMBULATORY_CARE_PROVIDER_SITE_OTHER): Payer: 59 | Admitting: Obstetrics & Gynecology

## 2017-11-25 ENCOUNTER — Other Ambulatory Visit: Payer: Self-pay

## 2017-11-25 ENCOUNTER — Encounter: Payer: Self-pay | Admitting: Obstetrics & Gynecology

## 2017-11-25 VITALS — BP 114/70 | HR 92 | Resp 16 | Ht 62.0 in | Wt 180.8 lb

## 2017-11-25 DIAGNOSIS — Z0000 Encounter for general adult medical examination without abnormal findings: Secondary | ICD-10-CM | POA: Diagnosis not present

## 2017-11-25 DIAGNOSIS — Z124 Encounter for screening for malignant neoplasm of cervix: Secondary | ICD-10-CM

## 2017-11-25 DIAGNOSIS — Z01419 Encounter for gynecological examination (general) (routine) without abnormal findings: Secondary | ICD-10-CM

## 2017-11-25 NOTE — Progress Notes (Signed)
62 y.o. [id number] Married White or Caucasian female here for annual exam.  Doing well.  Has a new grandson, born October 8th.  He was breech.  She had to have general anesthesia as her spinal didn't take.  He has left hip subluxation and has a harness that he will wear for several months.  Breast feeding has been good.  Denies vaginal bleeding.  Has mild hot flashes.    Needs blood work done today.    She has fully retired.    Patient's last menstrual period was 06/01/2013.          Sexually active: No.  The current method of family planning is post menopausal status.    Exercising: Yes.    walk 3 x weekly  Smoker:  no  Health Maintenance: Pap:  07/25/15 Neg. HR HPV:neg   04/27/13 Neg History of abnormal Pap:  yes MMG:  11/12/16 BIRADS1:Neg. Has appt 12/19/17 Colonoscopy:  2009. Will schedule.  Just received notice from Dr. Collene Mares. BMD:   06/03/14 Osteopenia  TDaP:  2019 Pneumonia vaccine(s):  PCP Shingrix:   No Hep C testing: done  Screening Labs: Here today -fasting CMP, CBC, TSH, Lipids    reports that she has never smoked. She has never used smokeless tobacco. She reports that she does not drink alcohol or use drugs.  Past Medical History:  Diagnosis Date  . Anxiety   . Asthma   . Dyslipidemia   . GERD (gastroesophageal reflux disease)   . HTN (hypertension)   . Hypothyroidism     Past Surgical History:  Procedure Laterality Date  . BREAST SURGERY Left 2000   Bening  . CARDIAC CATHETERIZATION    . CHOLECYSTECTOMY    . DIAGNOSTIC LAPAROSCOPY    . DILATION AND CURETTAGE OF UTERUS  04/12/2011   Procedure: DILATATION AND CURETTAGE;  Surgeon: Megan Salon, MD;  Location: [facility];  Service: Gynecology;  Laterality: N/A;  . INTRAUTERINE DEVICE INSERTION  04/19/11   Mirena    Current Outpatient Medications  Medication Sig Dispense Refill  . acetaminophen (TYLENOL) 500 MG tablet Take 1,000 mg by mouth as needed. For pain     . albuterol (PROVENTIL HFA;VENTOLIN HFA) 108 (90  Base) MCG/ACT inhaler Inhale 2 puffs into the lungs every 6 (six) hours as needed for wheezing or shortness of breath. 1 Inhaler prn  . ALPRAZolam (XANAX) 0.5 MG tablet Take 1 tablet (0.5 mg total) 3 (three) times daily as needed by mouth for anxiety. 90 tablet 1  . aspirin 81 MG EC tablet Take 81 mg by mouth daily. Stopped taking on 04/07/11 in preparation for surgery.    . cholecalciferol (VITAMIN D) 1000 UNITS tablet Take 1,000 Units by mouth daily.    . citalopram (CELEXA) 20 MG tablet Take 1 tablet (20 mg total) by mouth daily. (Patient taking differently: Take 20 mg by mouth daily. Take 1/2 tab daily) 180 tablet 3  . esomeprazole (NEXIUM) 20 MG capsule Take 20 mg by mouth daily at 12 noon.    . fexofenadine (ALLEGRA) 180 MG tablet Take 180 mg by mouth daily.    . Fluticasone-Salmeterol (ADVAIR DISKUS) 250-50 MCG/DOSE AEPB Inhale 1 puff into the lungs 2 (two) times daily. 180 each 3  . levothyroxine (SYNTHROID, LEVOTHROID) 75 MCG tablet Take 1 tablet (75 mcg total) by mouth daily. 90 tablet 1  . losartan-hydrochlorothiazide (HYZAAR) 50-12.5 MG tablet Take 1 tablet by mouth daily. (Patient taking differently: Take 0.5 tablets by mouth daily. ) 30 tablet  11  . pravastatin (PRAVACHOL) 40 MG tablet Take 1 tablet (40 mg total) by mouth at bedtime. 90 tablet 3   No current facility-administered medications for this visit.     Family History  Problem Relation Age of Onset  . Coronary artery disease Father   . COPD Father   . Cancer Father        Bladder  . Hyperlipidemia Mother   . Hypertension Mother     Review of Systems  All other systems reviewed and are negative.   Exam:   BP 114/70 (BP Location: Right Arm, Patient Position: Sitting, Cuff Size: Large)   Pulse 92   Resp 16   Ht 5\' 2"  (1.575 m)   Wt 180 lb 12.8 oz (82 kg)   LMP 06/01/2013 Comment: spotting  BMI 33.07 kg/m   Height: 5\' 2"  (157.5 cm)  Ht Readings from Last 3 Encounters:  11/25/17 5\' 2"  (1.575 m)  09/16/17 5\' 2"   (1.575 m)  06/17/17 5\' 2"  (1.575 m)    General appearance: alert, cooperative and appears stated age Head: Normocephalic, without obvious abnormality, atraumatic Neck: no adenopathy, supple, symmetrical, trachea midline and thyroid normal to inspection and palpation Lungs: clear to auscultation bilaterally Breasts: normal appearance, no masses or tenderness Heart: regular rate and rhythm Abdomen: soft, non-tender; bowel sounds normal; no masses,  no organomegaly Extremities: extremities normal, atraumatic, no cyanosis or edema Skin: Skin color, texture, turgor normal. No rashes or lesions Lymph nodes: Cervical, supraclavicular, and axillary nodes normal. No abnormal inguinal nodes palpated Neurologic: Grossly normal   Pelvic: External genitalia:  no lesions              Urethra:  normal appearing urethra with no masses, tenderness or lesions              Bartholins and Skenes: normal                 Vagina: normal appearing vagina with normal color and discharge, no lesions              Cervix: no lesions              Pap taken: Yes.   Bimanual Exam:  Uterus:  normal size, contour, position, consistency, mobility, non-tender              Adnexa: normal adnexa and no mass, fullness, tenderness               Rectovaginal: Confirms               Anus:  normal sphincter tone, no lesions  Chaperone was present for exam.  A:  Well Woman with normal exam PMP, no HRT Hyperlipidemia Hypertension H/O pelvic relaxation H?O simple endometrial hyperplasia 2/13.  Follow up biopsy 9/13 negative.  IUD placed 3/13.    P:   Mammogram is scheduled BMD planned for 5 years from last one CMP, CBC, Lipids, TSH will be obtained today pap smear obtained.  Neg HR HPV 2017. Information about Shingrix given She is going ot schedule her colonoscopy.   return annually or prn

## 2017-11-25 NOTE — Patient Instructions (Signed)
[facility] [address] [place],  [place]  [address]  Main: [phone] 

## 2017-11-26 LAB — COMPREHENSIVE METABOLIC PANEL
A/G RATIO: 1.6 (ref 1.2–2.2)
ALBUMIN: 4.6 g/dL (ref 3.6–4.8)
ALT: 15 IU/L (ref 0–32)
AST: 19 IU/L (ref 0–40)
Alkaline Phosphatase: 85 IU/L (ref 39–117)
BILIRUBIN TOTAL: 0.6 mg/dL (ref 0.0–1.2)
BUN / CREAT RATIO: 16 (ref 12–28)
BUN: 15 mg/dL (ref 8–27)
CHLORIDE: 96 mmol/L (ref 96–106)
CO2: 26 mmol/L (ref 20–29)
Calcium: 9.9 mg/dL (ref 8.7–10.3)
Creatinine, Ser: 0.93 mg/dL (ref 0.57–1.00)
GFR calc non Af Amer: 66 mL/min/{1.73_m2} (ref >59)
GFR, EST AFRICAN AMERICAN: 76 mL/min/{1.73_m2} (ref >59)
Globulin, Total: 2.8 g/dL (ref 1.5–4.5)
Glucose: 88 mg/dL (ref 65–99)
Potassium: 4.1 mmol/L (ref 3.5–5.2)
Sodium: 140 mmol/L (ref 134–144)
Total Protein: 7.4 g/dL (ref 6.0–8.5)

## 2017-11-26 LAB — CBC
HEMATOCRIT: 44.9 % (ref 34.0–46.6)
HEMOGLOBIN: 14.7 g/dL (ref 11.1–15.9)
MCH: 31.1 pg (ref 26.6–33.0)
MCHC: 32.7 g/dL (ref 31.5–35.7)
MCV: 95 fL (ref 79–97)
Platelets: 254 10*3/uL (ref 150–450)
RBC: 4.72 x10E6/uL (ref 3.77–5.28)
RDW: 13.4 % (ref 12.3–15.4)
WBC: 8.6 10*3/uL (ref 3.4–10.8)

## 2017-11-26 LAB — TSH: TSH: 2.08 u[IU]/mL (ref 0.450–4.500)

## 2017-11-26 LAB — LIPID PANEL
Chol/HDL Ratio: 2.2 ratio (ref 0.0–4.4)
Cholesterol, Total: 157 mg/dL (ref 100–199)
HDL: 70 mg/dL (ref >39)
LDL CALC: 69 mg/dL (ref 0–99)
Triglycerides: 88 mg/dL (ref 0–149)
VLDL CHOLESTEROL CAL: 18 mg/dL (ref 5–40)

## 2017-11-29 LAB — CYTOLOGY - PAP: DIAGNOSIS: NEGATIVE

## 2017-12-15 ENCOUNTER — Other Ambulatory Visit: Payer: Self-pay | Admitting: Internal Medicine

## 2017-12-15 DIAGNOSIS — I10 Essential (primary) hypertension: Secondary | ICD-10-CM

## 2017-12-15 DIAGNOSIS — J309 Allergic rhinitis, unspecified: Secondary | ICD-10-CM

## 2017-12-15 DIAGNOSIS — Z5181 Encounter for therapeutic drug level monitoring: Secondary | ICD-10-CM

## 2017-12-15 DIAGNOSIS — Z79899 Other long term (current) drug therapy: Secondary | ICD-10-CM

## 2017-12-15 DIAGNOSIS — Z0000 Encounter for general adult medical examination without abnormal findings: Secondary | ICD-10-CM

## 2017-12-15 DIAGNOSIS — E7849 Other hyperlipidemia: Secondary | ICD-10-CM

## 2017-12-15 DIAGNOSIS — E039 Hypothyroidism, unspecified: Secondary | ICD-10-CM

## 2017-12-15 DIAGNOSIS — K219 Gastro-esophageal reflux disease without esophagitis: Secondary | ICD-10-CM

## 2017-12-15 DIAGNOSIS — Z8709 Personal history of other diseases of the respiratory system: Secondary | ICD-10-CM

## 2017-12-16 ENCOUNTER — Other Ambulatory Visit: Payer: 59 | Admitting: Internal Medicine

## 2017-12-19 ENCOUNTER — Ambulatory Visit (INDEPENDENT_AMBULATORY_CARE_PROVIDER_SITE_OTHER): Payer: 59 | Admitting: Internal Medicine

## 2017-12-19 ENCOUNTER — Encounter: Payer: Self-pay | Admitting: Internal Medicine

## 2017-12-19 VITALS — BP 120/70 | HR 88 | Ht 62.0 in | Wt 183.0 lb

## 2017-12-19 DIAGNOSIS — E7849 Other hyperlipidemia: Secondary | ICD-10-CM

## 2017-12-19 DIAGNOSIS — E039 Hypothyroidism, unspecified: Secondary | ICD-10-CM

## 2017-12-19 DIAGNOSIS — K219 Gastro-esophageal reflux disease without esophagitis: Secondary | ICD-10-CM

## 2017-12-19 DIAGNOSIS — Z1231 Encounter for screening mammogram for malignant neoplasm of breast: Secondary | ICD-10-CM | POA: Diagnosis not present

## 2017-12-19 DIAGNOSIS — Z8709 Personal history of other diseases of the respiratory system: Secondary | ICD-10-CM

## 2017-12-19 DIAGNOSIS — I10 Essential (primary) hypertension: Secondary | ICD-10-CM

## 2017-12-19 DIAGNOSIS — Z0000 Encounter for general adult medical examination without abnormal findings: Secondary | ICD-10-CM

## 2017-12-19 DIAGNOSIS — R8290 Unspecified abnormal findings in urine: Secondary | ICD-10-CM | POA: Diagnosis not present

## 2017-12-19 DIAGNOSIS — J309 Allergic rhinitis, unspecified: Secondary | ICD-10-CM

## 2017-12-19 LAB — POCT URINALYSIS DIPSTICK
BILIRUBIN UA: NEGATIVE
GLUCOSE UA: NEGATIVE
KETONES UA: NEGATIVE
Nitrite, UA: NEGATIVE
Protein, UA: NEGATIVE
SPEC GRAV UA: 1.01 (ref 1.010–1.025)
Urobilinogen, UA: 0.2 E.U./dL
pH, UA: 6 (ref 5.0–8.0)

## 2017-12-19 MED ORDER — TRIAMCINOLONE ACETONIDE 0.1 % EX CREA: 1.0000 "application " | TOPICAL_CREAM | Freq: Two times a day (BID) | CUTANEOUS | 1 refills | Status: DC

## 2017-12-19 NOTE — Patient Instructions (Addendum)
It was a pleasure to see you today. RTC in 6 months.  Continue same medications.

## 2017-12-19 NOTE — Progress Notes (Signed)
Subjective:    Patient ID: Donna Reynolds, female    DOB: February 27, 1955, 62 y.o.   MRN: [id number]  HPI 62 year old Female for health maintenance exam and evaluation of medical issues.  History of hypertension, hyperlipidemia, allergic rhinitis, asthma, GE reflux and hypothyroidism.  Dr. Sabra Heck is GYN physician and she had labs reviewed her office recently.  In May 2015 she had an episode of postmenopausal bleeding and ultrasound showed a new left ovarian cyst which appeared to be benign.  History of endometrial hyperplasia without atypia treated March 2013 with D&C followed by Mirena IUD with repeat biopsy done in September 2013 which was negative.  No further episodes of bleeding and Mirena has been removed.  History of allergic rhinitis tested by Dr. Donneta Romberg in the past and was found to have positive skin tests to house dust, dust mites, cockroach, mold mix, slight reaction to grass.  Not positive to trees, dog, cat or feathers.  Spirometry was normal.  Uses inhalers as needed.  Sometimes is wheezing.  In 2011 she had sciatica of the right leg for which tramadol was prescribed.  Occasionally has dependent edema for which she takes as needed Lasix.  History of benign breast biopsy in the past.  Cardiology evaluation February 2012 for chest pain including cardiac cath was negative.  Patient had endoscopy and colonoscopy by Dr. Collene Mares 2009.  There was an isolated diverticulum in her left colon otherwise normal.  Had endoscopy at the time of colonoscopy because of melena.  She had been taking a lot of ibuprofen for dental problems.  Endoscopy showed gastric polyps.  History of shingles 2006.  History of left lower lobe pneumonia August 2006.  Had Pneumovax in 1998.  History of diverticulitis 2001.  Positive measles titer 1999.  Laparoscopic cholecystectomy in March 1996.  Had mononucleosis in the 1990s.  History of hospitalization for chickenpox in the 43s.  History of migraine  headaches.  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  Has tried Topamax in the past by Dr. Jannifer Franklin.  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  She is taking some time off to be with her new grandchild.  One adult daughter is married and now lives in [place].  Patient is a native of [place].  She attended [employer] of [employer].  She is married to a retired Software engineer.  Her parents immigrated from [place].  She is an only child.  Non-smoker.  Social alcohol consumption.  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  Family history: Father died of complications of congestive heart failure, had history of chronic kidney disease and history of bladder cancer.  Mother with history of hypertension and hyperlipidemia doing well    Review of Systems  HENT: Negative.   Respiratory: Negative.   Cardiovascular: Negative.   Gastrointestinal: Negative.   Genitourinary: Negative.   Neurological: Negative.   Psychiatric/Behavioral: Negative.        Objective:   Physical Exam  Constitutional: She is oriented to person, place, and time. She appears well-developed and well-nourished. No distress.  HENT:  Head: Normocephalic and atraumatic.  Right Ear: External ear normal.  Left Ear: External ear normal.  Nose: Nose normal.  Mouth/Throat: Oropharynx is clear and moist. No oropharyngeal exudate.  Eyes: Pupils are equal, round,  and reactive to light. Conjunctivae are normal. Right eye exhibits no discharge. Left eye exhibits no discharge. No scleral icterus.  Neck: Neck supple. No JVD present. No thyromegaly present.  Cardiovascular: Normal rate, regular rhythm and normal heart sounds.  No murmur heard. Pulmonary/Chest: Effort normal and breath sounds normal. No stridor. No  respiratory distress. She has no wheezes. She has no rales.  Breasts normal female  Abdominal: Soft. Bowel sounds are normal. She exhibits no distension and no mass. There is no tenderness. There is no rebound and no guarding.  Genitourinary:  Genitourinary Comments: Deferred to Dr. Sabra Heck  Musculoskeletal: She exhibits no edema.  Neurological: She is alert and oriented to person, place, and time.  Skin: Skin is warm and dry. No rash noted. She is not diaphoretic.  Psychiatric: She has a normal mood and affect. Her behavior is normal. Judgment normal.  Vitals reviewed.         Assessment & Plan:  Normal health maintenance exam  Essential hypertension-stable on current regimen  Hyperlipidemia-stable on statin  Hypothyroidism-stable on thyroid replacement therapy  Allergic rhinitis and asthma-has inhalers to take on a as needed basis  GE reflux treated with PPI  Plan: Continue current medications and return in 6 months.  Dipstick UA is abnormal.  Culture sent and grew 3 or more organisms consistent with contaminant.  No UTI symptoms.

## 2017-12-20 LAB — URINE CULTURE
MICRO NUMBER:: 91385731
SPECIMEN QUALITY:: ADEQUATE

## 2017-12-20 LAB — HIV ANTIBODY (ROUTINE TESTING W REFLEX): HIV 1&2 Ab, 4th Generation: NONREACTIVE

## 2017-12-20 LAB — HEPATITIS C ANTIBODY
Hepatitis C Ab: NONREACTIVE
SIGNAL TO CUT-OFF: 0.01 (ref <1.00)

## 2018-01-18 ENCOUNTER — Encounter: Payer: Self-pay | Admitting: Obstetrics & Gynecology

## 2018-02-27 ENCOUNTER — Other Ambulatory Visit: Payer: Self-pay

## 2018-02-27 MED ORDER — LOSARTAN POTASSIUM-HCTZ 50-12.5 MG PO TABS: 1.0000 | ORAL_TABLET | Freq: Every day | ORAL | 3 refills | Status: DC

## 2018-03-13 ENCOUNTER — Other Ambulatory Visit: Payer: Self-pay

## 2018-03-13 NOTE — Telephone Encounter (Signed)
Written as requested #90 with prn one year refills

## 2018-03-13 NOTE — Telephone Encounter (Signed)
Husband called to request a refill on synthroid but they are requesting a hard copy and they would like to pick it in the morning.

## 2018-06-16 ENCOUNTER — Other Ambulatory Visit: Payer: Self-pay

## 2018-06-16 ENCOUNTER — Encounter: Payer: Self-pay | Admitting: Internal Medicine

## 2018-06-16 ENCOUNTER — Other Ambulatory Visit: Payer: 59 | Admitting: Internal Medicine

## 2018-06-16 VITALS — BP 128/70 | HR 74 | Temp 98.4°F | Wt 166.0 lb

## 2018-06-16 DIAGNOSIS — E7849 Other hyperlipidemia: Secondary | ICD-10-CM | POA: Diagnosis not present

## 2018-06-16 LAB — LIPID PANEL
Cholesterol: 142 mg/dL (ref <200)
HDL: 65 mg/dL (ref >=50)
LDL Cholesterol (Calc): 63 mg/dL (calc)
Non-HDL Cholesterol (Calc): 77 mg/dL (calc) (ref <130)
Total CHOL/HDL Ratio: 2.2 (calc) (ref <5.0)
Triglycerides: 62 mg/dL (ref <150)

## 2018-06-16 LAB — HEPATIC FUNCTION PANEL
AG Ratio: 1.5 (calc) (ref 1.0–2.5)
ALT: 10 U/L (ref 6–29)
AST: 13 U/L (ref 10–35)
Albumin: 4.1 g/dL (ref 3.6–5.1)
Alkaline phosphatase (APISO): 74 U/L (ref 37–153)
Bilirubin, Direct: 0.1 mg/dL (ref 0.0–0.2)
Globulin: 2.7 g/dL (calc) (ref 1.9–3.7)
Indirect Bilirubin: 0.5 mg/dL (calc) (ref 0.2–1.2)
Total Bilirubin: 0.6 mg/dL (ref 0.2–1.2)
Total Protein: 6.8 g/dL (ref 6.1–8.1)

## 2018-06-19 ENCOUNTER — Ambulatory Visit (INDEPENDENT_AMBULATORY_CARE_PROVIDER_SITE_OTHER): Payer: 59 | Admitting: Internal Medicine

## 2018-06-19 DIAGNOSIS — I10 Essential (primary) hypertension: Secondary | ICD-10-CM | POA: Diagnosis not present

## 2018-06-19 DIAGNOSIS — R69 Illness, unspecified: Secondary | ICD-10-CM | POA: Diagnosis not present

## 2018-06-19 DIAGNOSIS — Z8659 Personal history of other mental and behavioral disorders: Secondary | ICD-10-CM

## 2018-06-19 DIAGNOSIS — E039 Hypothyroidism, unspecified: Secondary | ICD-10-CM | POA: Diagnosis not present

## 2018-06-19 DIAGNOSIS — Z8709 Personal history of other diseases of the respiratory system: Secondary | ICD-10-CM | POA: Diagnosis not present

## 2018-06-19 DIAGNOSIS — K219 Gastro-esophageal reflux disease without esophagitis: Secondary | ICD-10-CM | POA: Diagnosis not present

## 2018-06-19 DIAGNOSIS — E785 Hyperlipidemia, unspecified: Secondary | ICD-10-CM | POA: Diagnosis not present

## 2018-06-19 NOTE — Progress Notes (Signed)
   Subjective:    Patient ID: Donna Reynolds, female    DOB: Jun 18, 1955, 63 y.o.   MRN: [id number]  HPI 63 year old Female in today for 89-month recheck.  Seen by interactive audio and video telecommunications due to the coronavirus pandemic.  She is identified using 2 identifiers as Donna Reynolds, a longstanding patient in this practice.  She agrees to visit in this format today.  She has a history of hypertension, hyperlipidemia and hypothyroidism.  She has a history of asthma.  She is a Engineer, drilling, Soil scientist  In [employer], currently taking some time off to spend with new grandchild.  For this visit she had lipid panel and liver functions drawn.  These are entirely normal.  Total cholesterol 142, HDL cholesterol 65, triglycerides 62 and LDL cholesterol 63.  Liver panel shows SGOT 13, SGPT 10 and alkaline phosphatase 74 with normal bilirubin and bilirubin subfractions.  TSH was not checked for this visit.  She is on levothyroxine 0.75 mg daily.  Patient indicates that her blood pressure has been under good control on current regimen of losartan HCTZ.  She has GE reflux and takes generic Nexium.  History of anxiety treated with Celexa and alprazolam.  Is on pravastatin for hyperlipidemia.  History of asthma treated with Advair and albuterol on a as needed basis.             Review of Systems no new complaints     Objective:   Physical Exam Not examined due to virtual visit format.  Patient reports her blood pressure is stable at home.  Is compliant with statin medication.  Is compliant with antihypertensive medication.  Lipid panel and liver functions are entirely normal.  TSH was not checked for this visit.       Assessment & Plan:  Hyperlipidemia-stable on statin medication.  Liver functions are normal.  Continue same dose of statin medication.  Follow-up in 6 weeks  Essential hypertension-blood pressure reported stable by  patient  Hypothyroidism-continue thyroid replacement and follow-up at time of physical exam in 6 months.  History of anxiety-stable with medication  History of asthma-stable with inhalers  GE reflux-treated with PPI.

## 2018-06-28 ENCOUNTER — Encounter: Payer: Self-pay | Admitting: Internal Medicine

## 2018-06-28 NOTE — Patient Instructions (Addendum)
It was a pleasure to see you today by virtual visit.  Continue same medications and follow-up with physical exam in 6 months.  Lipid panel and liver functions are normal.  Continue to monitor blood pressure at home.

## 2018-07-03 DIAGNOSIS — H2513 Age-related nuclear cataract, bilateral: Secondary | ICD-10-CM | POA: Diagnosis not present

## 2018-07-17 DIAGNOSIS — H43811 Vitreous degeneration, right eye: Secondary | ICD-10-CM | POA: Diagnosis not present

## 2018-07-28 DIAGNOSIS — H43811 Vitreous degeneration, right eye: Secondary | ICD-10-CM | POA: Diagnosis not present

## 2018-09-04 ENCOUNTER — Telehealth: Payer: Self-pay | Admitting: Internal Medicine

## 2018-09-04 MED ORDER — FLUTICASONE-SALMETEROL 250-50 MCG/DOSE IN AEPB: 1.0000 | INHALATION_SPRAY | Freq: Two times a day (BID) | RESPIRATORY_TRACT | 3 refills | Status: DC

## 2018-09-04 NOTE — Telephone Encounter (Signed)
Donna Reynolds [phone]  Advair   Donna Reynolds would like to get a written prescription for Advair, she mails it in to be filled. Call when ready and she will come and pick it up.

## 2018-10-12 DIAGNOSIS — Z23 Encounter for immunization: Secondary | ICD-10-CM | POA: Diagnosis not present

## 2018-12-15 ENCOUNTER — Other Ambulatory Visit: Payer: Self-pay

## 2018-12-15 ENCOUNTER — Other Ambulatory Visit: Payer: 59 | Admitting: Internal Medicine

## 2018-12-15 DIAGNOSIS — Z5181 Encounter for therapeutic drug level monitoring: Secondary | ICD-10-CM | POA: Diagnosis not present

## 2018-12-15 DIAGNOSIS — E039 Hypothyroidism, unspecified: Secondary | ICD-10-CM | POA: Diagnosis not present

## 2018-12-15 DIAGNOSIS — Z8709 Personal history of other diseases of the respiratory system: Secondary | ICD-10-CM | POA: Diagnosis not present

## 2018-12-15 DIAGNOSIS — Z79899 Other long term (current) drug therapy: Secondary | ICD-10-CM

## 2018-12-15 DIAGNOSIS — I10 Essential (primary) hypertension: Secondary | ICD-10-CM | POA: Diagnosis not present

## 2018-12-15 DIAGNOSIS — J309 Allergic rhinitis, unspecified: Secondary | ICD-10-CM

## 2018-12-15 DIAGNOSIS — K219 Gastro-esophageal reflux disease without esophagitis: Secondary | ICD-10-CM | POA: Diagnosis not present

## 2018-12-15 DIAGNOSIS — E7849 Other hyperlipidemia: Secondary | ICD-10-CM

## 2018-12-15 DIAGNOSIS — Z0000 Encounter for general adult medical examination without abnormal findings: Secondary | ICD-10-CM

## 2018-12-16 LAB — TSH: TSH: 0.58 mIU/L (ref 0.40–4.50)

## 2018-12-16 LAB — CBC WITH DIFFERENTIAL/PLATELET
Absolute Monocytes: 765 cells/uL (ref 200–950)
Basophils Absolute: 83 cells/uL (ref 0–200)
Basophils Relative: 1.1 %
Eosinophils Absolute: 128 cells/uL (ref 15–500)
Eosinophils Relative: 1.7 %
HCT: 43 % (ref 35.0–45.0)
Hemoglobin: 14.3 g/dL (ref 11.7–15.5)
Lymphs Abs: 2318 cells/uL (ref 850–3900)
MCH: 31.4 pg (ref 27.0–33.0)
MCHC: 33.3 g/dL (ref 32.0–36.0)
MCV: 94.5 fL (ref 80.0–100.0)
MPV: 11.3 fL (ref 7.5–12.5)
Monocytes Relative: 10.2 %
Neutro Abs: 4208 cells/uL (ref 1500–7800)
Neutrophils Relative %: 56.1 %
Platelets: 261 10*3/uL (ref 140–400)
RBC: 4.55 10*6/uL (ref 3.80–5.10)
RDW: 13 % (ref 11.0–15.0)
Total Lymphocyte: 30.9 %
WBC: 7.5 10*3/uL (ref 3.8–10.8)

## 2018-12-16 LAB — COMPLETE METABOLIC PANEL WITH GFR
AG Ratio: 1.5 (calc) (ref 1.0–2.5)
ALT: 8 U/L (ref 6–29)
AST: 13 U/L (ref 10–35)
Albumin: 4.2 g/dL (ref 3.6–5.1)
Alkaline phosphatase (APISO): 72 U/L (ref 37–153)
BUN: 19 mg/dL (ref 7–25)
CO2: 29 mmol/L (ref 20–32)
Calcium: 9.7 mg/dL (ref 8.6–10.4)
Chloride: 101 mmol/L (ref 98–110)
Creat: 0.76 mg/dL (ref 0.50–0.99)
GFR, Est African American: 97 mL/min/{1.73_m2} (ref >=60)
GFR, Est Non African American: 83 mL/min/{1.73_m2} (ref >=60)
Globulin: 2.8 g/dL (calc) (ref 1.9–3.7)
Glucose, Bld: 94 mg/dL (ref 65–99)
Potassium: 3.7 mmol/L (ref 3.5–5.3)
Sodium: 139 mmol/L (ref 135–146)
Total Bilirubin: 0.7 mg/dL (ref 0.2–1.2)
Total Protein: 7 g/dL (ref 6.1–8.1)

## 2018-12-16 LAB — LIPID PANEL
Cholesterol: 134 mg/dL (ref <200)
HDL: 62 mg/dL (ref >=50)
LDL Cholesterol (Calc): 58 mg/dL (calc)
Non-HDL Cholesterol (Calc): 72 mg/dL (calc) (ref <130)
Total CHOL/HDL Ratio: 2.2 (calc) (ref <5.0)
Triglycerides: 55 mg/dL (ref <150)

## 2018-12-16 LAB — VITAMIN D 25 HYDROXY (VIT D DEFICIENCY, FRACTURES): Vit D, 25-Hydroxy: 44 ng/mL (ref 30–100)

## 2018-12-25 ENCOUNTER — Other Ambulatory Visit: Payer: 59 | Admitting: Internal Medicine

## 2019-01-01 ENCOUNTER — Other Ambulatory Visit: Payer: Self-pay

## 2019-01-01 ENCOUNTER — Ambulatory Visit (INDEPENDENT_AMBULATORY_CARE_PROVIDER_SITE_OTHER): Payer: 59 | Admitting: Internal Medicine

## 2019-01-01 ENCOUNTER — Encounter: Payer: Self-pay | Admitting: Internal Medicine

## 2019-01-01 VITALS — BP 100/70 | HR 68 | Ht 62.0 in | Wt 161.0 lb

## 2019-01-01 DIAGNOSIS — E7849 Other hyperlipidemia: Secondary | ICD-10-CM | POA: Diagnosis not present

## 2019-01-01 DIAGNOSIS — J309 Allergic rhinitis, unspecified: Secondary | ICD-10-CM | POA: Diagnosis not present

## 2019-01-01 DIAGNOSIS — R69 Illness, unspecified: Secondary | ICD-10-CM | POA: Diagnosis not present

## 2019-01-01 DIAGNOSIS — Z8709 Personal history of other diseases of the respiratory system: Secondary | ICD-10-CM | POA: Diagnosis not present

## 2019-01-01 DIAGNOSIS — Z8659 Personal history of other mental and behavioral disorders: Secondary | ICD-10-CM

## 2019-01-01 DIAGNOSIS — E039 Hypothyroidism, unspecified: Secondary | ICD-10-CM

## 2019-01-01 DIAGNOSIS — I10 Essential (primary) hypertension: Secondary | ICD-10-CM | POA: Diagnosis not present

## 2019-01-01 DIAGNOSIS — Z0000 Encounter for general adult medical examination without abnormal findings: Secondary | ICD-10-CM

## 2019-01-01 DIAGNOSIS — E785 Hyperlipidemia, unspecified: Secondary | ICD-10-CM

## 2019-01-01 LAB — POCT URINALYSIS DIPSTICK
Appearance: NEGATIVE
Bilirubin, UA: NEGATIVE
Blood, UA: NEGATIVE
Glucose, UA: NEGATIVE
Ketones, UA: NEGATIVE
Leukocytes, UA: NEGATIVE
Nitrite, UA: NEGATIVE
Odor: NEGATIVE
Protein, UA: NEGATIVE
Spec Grav, UA: 1.015 (ref 1.010–1.025)
Urobilinogen, UA: 0.2 E.U./dL
pH, UA: 6 (ref 5.0–8.0)

## 2019-01-01 NOTE — Patient Instructions (Addendum)
It was a pleasure to see you  today.  Stay safe and well during the pandemic.  Order placed for bone density study at [facility].  Labs are stable on current medication regimen.  Follow-up in May.

## 2019-01-01 NOTE — Progress Notes (Signed)
Subjective:    Patient ID: Donna Reynolds, female    DOB: 1955/05/02, 63 y.o.   MRN: [id number]  HPI 63 year old Female for health maintenance exam and evaluation of medical issues.  She has a history of hypertension, hyperlipidemia, allergic rhinitis, asthma, GE reflux and hypothyroidism.  Dr. Sabra Heck is GYN physician.  In May 2015 she had an episode of postmenopausal bleeding and ultrasound showed a new left ovarian cyst which appeared to be benign.  History of endometrial hyperplasia without atypia treated in March 2013 with D&C followed by Mirena IUD.  Repeat biopsy done September 2014 was negative.  No further episodes of bleeding MRI note has been removed.  History of allergic rhinitis tested by Dr. Donneta Romberg in the past and was found to have positive skin test to house dust, dust mites cockroach mold mix, slight reactions to grasses.  Not positive to trees dog cat or feathers.  Spirometry was normal.  Uses inhalers as needed.  Sometimes has wheezing.  In 2011 had sciatica of right leg for which tramadol was prescribed  Occasionally has dependent edema for which Lasix has been prescribed as needed.  History of benign breast biopsy in the past.  Cardiac evaluation February 2012 for chest pain including cardiac cath was negative.  Patient had endoscopy and colonoscopy by Dr. Collene Mares in 2009.  Endoscopy was done at the time of colonoscopy because of melena.  She has been taking a lot of ibuprofen for dental problems.  Endoscopy showed gastric polyps.  History of shingles 2006.  History of left lower lobe pneumonia August 2006.  Had Pneumovax in 1998.  History of diverticulitis 2001.  Positive measles titer 1999.  Laparoscopic cholecystectomy in March 1996.  Had mononucleosis in the 1990s.  Hospitalization for chickenpox in the 1980s.  History of migraine headaches.  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   Has tried Topamax in the past by Dr. Jannifer Franklin.  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  She has been taking some time off to be with her new grandchild.  1 adult daughter is married and now living in the [place] area.  Patient is a native of [place].  She attended [employer].  She is married to a retired Software engineer who just recently had coronary artery bypass graft surgery and is doing well.  Her parents immigrated from [place].  She is an only child.  Non-smoker.  Social alcohol consumption.  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  Mother living here in [place].  Family history: Father died of complications of congestive heart failure, had history of chronic kidney disease and history of bladder cancer.  Mother with history of hypertension and hyperlipidemia doing well residing alone near her daughter.    Review of Systems  Constitutional: Negative.   All other systems reviewed and are negative.      Objective:   Physical Exam Blood pressure 100/70, pulse 68 pulse oximetry 98% weight 161 pounds height 5 feet 2 inches BMI 29.45  Skin warm and dry.  Nodes none.  TMs are clear.  Neck is supple without JVD thyromegaly or carotid bruits.  Chest clear to auscultation.  Breast without masses.  Cardiac exam regular rate and rhythm with normal S1 and S2 without murmurs or gallops.  Abdomen soft nondistended without hepatosplenomegaly masses or tenderness.  GYN exam deferred to GYN physician.  No lower extremity edema.  Neuro intact without focal deficits.  Thought judgment and affect are normal.       Assessment & Plan:  All labs reviewed including vitamin D, TSH, lipid panel, CBC and C met and are within normal limits.  Exam  Essential hypertension-excellent control  Hyperlipidemia-normal lipids on statin medication   Hypothyroidism-normal TSH on thyroid replacement medication  Allergic rhinitis and asthma-has inhalers to take on a as needed basis  GE reflux treated with PPI  Plan: Continue current medications and return in 6  months or as needed.  Order placed for bone density study at [facility].

## 2019-01-03 ENCOUNTER — Encounter: Payer: Self-pay | Admitting: Obstetrics & Gynecology

## 2019-01-03 DIAGNOSIS — Z1231 Encounter for screening mammogram for malignant neoplasm of breast: Secondary | ICD-10-CM | POA: Diagnosis not present

## 2019-01-05 ENCOUNTER — Other Ambulatory Visit: Payer: 59 | Admitting: Internal Medicine

## 2019-01-08 ENCOUNTER — Encounter: Payer: 59 | Admitting: Internal Medicine

## 2019-01-15 ENCOUNTER — Other Ambulatory Visit: Payer: Self-pay

## 2019-01-15 MED ORDER — CITALOPRAM HYDROBROMIDE 10 MG PO TABS: 10.0000 mg | ORAL_TABLET | Freq: Every day | ORAL | 3 refills | Status: DC

## 2019-01-15 MED ORDER — PRAVASTATIN SODIUM 40 MG PO TABS: 40.0000 mg | ORAL_TABLET | Freq: Every day | ORAL | 3 refills | Status: DC

## 2019-01-15 MED ORDER — LOSARTAN POTASSIUM-HCTZ 50-12.5 MG PO TABS: 1.0000 | ORAL_TABLET | Freq: Every day | ORAL | 3 refills | Status: DC

## 2019-01-15 MED ORDER — LEVOTHYROXINE SODIUM 75 MCG PO TABS: 75.0000 ug | ORAL_TABLET | Freq: Every day | ORAL | 3 refills | Status: DC

## 2019-02-12 DIAGNOSIS — L719 Rosacea, unspecified: Secondary | ICD-10-CM | POA: Diagnosis not present

## 2019-02-12 DIAGNOSIS — D1801 Hemangioma of skin and subcutaneous tissue: Secondary | ICD-10-CM | POA: Diagnosis not present

## 2019-02-16 ENCOUNTER — Ambulatory Visit: Payer: 59

## 2019-02-23 ENCOUNTER — Other Ambulatory Visit: Payer: 59 | Admitting: Internal Medicine

## 2019-02-27 ENCOUNTER — Other Ambulatory Visit: Payer: Self-pay

## 2019-03-01 ENCOUNTER — Ambulatory Visit (INDEPENDENT_AMBULATORY_CARE_PROVIDER_SITE_OTHER): Payer: BC Managed Care – PPO | Admitting: Obstetrics & Gynecology

## 2019-03-01 ENCOUNTER — Other Ambulatory Visit (HOSPITAL_COMMUNITY)
Admission: RE | Admit: 2019-03-01 | Discharge: 2019-03-01 | Disposition: A | Discharge status: Home / Self Care | Payer: 59 | Source: Ambulatory Visit | Attending: Obstetrics & Gynecology | Admitting: Obstetrics & Gynecology

## 2019-03-01 ENCOUNTER — Other Ambulatory Visit: Payer: Self-pay

## 2019-03-01 ENCOUNTER — Encounter: Payer: Self-pay | Admitting: Obstetrics & Gynecology

## 2019-03-01 VITALS — BP 112/62 | HR 76 | Temp 98.2°F | Resp 10 | Ht 62.0 in | Wt 159.0 lb

## 2019-03-01 DIAGNOSIS — Z01419 Encounter for gynecological examination (general) (routine) without abnormal findings: Secondary | ICD-10-CM | POA: Diagnosis not present

## 2019-03-01 DIAGNOSIS — Z124 Encounter for screening for malignant neoplasm of cervix: Secondary | ICD-10-CM

## 2019-03-01 DIAGNOSIS — Z8742 Personal history of other diseases of the female genital tract: Secondary | ICD-10-CM

## 2019-03-01 NOTE — Progress Notes (Signed)
64 y.o. [id number] Married White or Caucasian female here for annual exam.  Doing well.  Yolanda Bonine is 16 months.  He is "all boy".  Son-in-law and daughter are working from home.    Did lose five family members in [place] with [place].  This has been very sad.  Husband had 5 vessel CABG.  He did not have any heart damage.  They have changed their diet a lot and she has lost weight.   Denies vaginal bleeding.    Patient's last menstrual period was 06/01/2013.          Sexually active: No.  The current method of family planning is post menopausal status.    Exercising: Yes.    walking Smoker:  no  Health Maintenance:  Pap:    11/25/17 Neg  07/25/15 Neg. HR HPV:neg             04/27/13 Neg History of abnormal Pap:  yes MMG:  01/03/19 BIRADS 1 negative/density b Colonoscopy:  2009.  Will schedule.  Patient was scheduled last year.   however appt got canceled due to [place] BMD:   06/03/14 Osteopenia.  Will plan with your next MMG.   TDaP:  2019 Pneumonia vaccine(s):  PCP Shingrix:   Considering having this later this year Hep C testing: 12/19/17 Neg Screening Labs: PCP   reports that she has never smoked. She has never used smokeless tobacco. She reports that she does not drink alcohol or use drugs.  Past Medical History:  Diagnosis Date  . Anxiety   . Asthma   . Dyslipidemia   . GERD (gastroesophageal reflux disease)   . HTN (hypertension)   . Hypothyroidism     Past Surgical History:  Procedure Laterality Date  . BREAST SURGERY Left 2000   Bening  . CARDIAC CATHETERIZATION    . CHOLECYSTECTOMY    . DIAGNOSTIC LAPAROSCOPY    . DILATION AND CURETTAGE OF UTERUS  04/12/2011   Procedure: DILATATION AND CURETTAGE;  Surgeon: Megan Salon, MD;  Location: [facility];  Service: Gynecology;  Laterality: N/A;  . INTRAUTERINE DEVICE INSERTION  04/19/11   Mirena    Current Outpatient Medications  Medication Sig Dispense Refill  . acetaminophen (TYLENOL) 500 MG tablet Take 1,000 mg by mouth as  needed. For pain     . aspirin 81 MG EC tablet Take 81 mg by mouth daily. Stopped taking on 04/07/11 in preparation for surgery.    . cholecalciferol (VITAMIN D) 1000 UNITS tablet Take 1,000 Units by mouth daily.    . citalopram (CELEXA) 10 MG tablet Take 1 tablet (10 mg total) by mouth daily. 90 tablet 3  . esomeprazole (NEXIUM) 20 MG capsule Take 20 mg by mouth daily at 12 noon.    . Fluticasone-Salmeterol (ADVAIR DISKUS) 250-50 MCG/DOSE AEPB Inhale 1 puff into the lungs 2 (two) times daily. 180 each 3  . levothyroxine (SYNTHROID) 75 MCG tablet Take 1 tablet (75 mcg total) by mouth daily. 90 tablet 3  . losartan-hydrochlorothiazide (HYZAAR) 50-12.5 MG tablet Take 1 tablet by mouth daily. 90 tablet 3  . Omega-3 1000 MG CAPS     . pravastatin (PRAVACHOL) 40 MG tablet Take 1 tablet (40 mg total) by mouth at bedtime. 90 tablet 3  . triamcinolone cream (KENALOG) 0.1 % Apply 1 application topically 2 (two) times daily. 45 g 1  . albuterol (PROVENTIL HFA;VENTOLIN HFA) 108 (90 Base) MCG/ACT inhaler Inhale 2 puffs into the lungs every 6 (six) hours as needed for wheezing  or shortness of breath. (Patient not taking: Reported on 03/01/2019) 1 Inhaler prn  . ALPRAZolam (XANAX) 0.5 MG tablet Take 1 tablet (0.5 mg total) 3 (three) times daily as needed by mouth for anxiety. (Patient not taking: Reported on 03/01/2019) 90 tablet 1   No current facility-administered medications for this visit.    Family History  Problem Relation Age of Onset  . Coronary artery disease Father   . COPD Father   . Cancer Father        Bladder  . Hyperlipidemia Mother   . Hypertension Mother     Review of Systems  All other systems reviewed and are negative.   Exam:   BP 112/62 (BP Location: Right Arm, Patient Position: Sitting, Cuff Size: Normal)   Pulse 76   Temp 98.2 F (36.8 C) (Temporal)   Resp 10   Ht 5\' 2"  (1.575 m)   Wt 159 lb (72.1 kg)   LMP 06/01/2013 Comment: spotting  BMI 29.08 kg/m    Height: 5\' 2"   (157.5 cm)  Ht Readings from Last 3 Encounters:  03/01/19 5\' 2"  (1.575 m)  01/01/19 5\' 2"  (1.575 m)  12/19/17 5\' 2"  (1.575 m)    General appearance: alert, cooperative and appears stated age Head: Normocephalic, without obvious abnormality, atraumatic Neck: no adenopathy, supple, symmetrical, trachea midline and thyroid normal to inspection and palpation Lungs: clear to auscultation bilaterally Breasts: normal appearance, no masses or tenderness Heart: regular rate and rhythm Abdomen: soft, non-tender; bowel sounds normal; no masses,  no organomegaly Extremities: extremities normal, atraumatic, no cyanosis or edema Skin: Skin color, texture, turgor normal. No rashes or lesions Lymph nodes: Cervical, supraclavicular, and axillary nodes normal. No abnormal inguinal nodes palpated Neurologic: Grossly normal   Pelvic: External genitalia:  no lesions              Urethra:  normal appearing urethra with no masses, tenderness or lesions              Bartholins and Skenes: normal                 Vagina: normal appearing vagina with normal color and discharge, no lesions              Cervix: no lesions              Pap taken: No. Bimanual Exam:  Uterus:  normal size, contour, position, consistency, mobility, non-tender              Adnexa: normal adnexa and no mass, fullness, tenderness               Rectovaginal: Confirms               Anus:  normal sphincter tone, no lesions  Chaperone, Terence Lux, CMA, was present for exam.  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  IUD placed 3/13.  Follow up biopsy 9/13.  Current UTD guidelines reviewed.  P:   Mammogram guidelines reviewed.  Done 01/2019.  pap smear obtained today per pt request Lab work done 12/2018 Will plan colonoscopy  BMD order faxed to [facility] vaccination discussed.  She  will wait until after having the Covid vaccine before having this done. return annually or prn

## 2019-03-02 ENCOUNTER — Encounter: Payer: 59 | Admitting: Internal Medicine

## 2019-03-02 LAB — CYTOLOGY - PAP: Diagnosis: NEGATIVE

## 2019-03-07 ENCOUNTER — Encounter: Payer: Self-pay | Admitting: Obstetrics & Gynecology

## 2019-03-07 DIAGNOSIS — M85852 Other specified disorders of bone density and structure, left thigh: Secondary | ICD-10-CM | POA: Diagnosis not present

## 2019-03-12 ENCOUNTER — Telehealth: Payer: Self-pay

## 2019-03-12 NOTE — Telephone Encounter (Signed)
Patient has been notified of BMD as seen by Dr. Sabra Heck and verbalizes understanding. Closing encounter.

## 2019-06-22 ENCOUNTER — Other Ambulatory Visit: Payer: Self-pay

## 2019-06-22 ENCOUNTER — Other Ambulatory Visit: Payer: 59 | Admitting: Internal Medicine

## 2019-06-22 DIAGNOSIS — E785 Hyperlipidemia, unspecified: Secondary | ICD-10-CM | POA: Diagnosis not present

## 2019-06-22 DIAGNOSIS — E039 Hypothyroidism, unspecified: Secondary | ICD-10-CM | POA: Diagnosis not present

## 2019-06-23 LAB — HEPATIC FUNCTION PANEL
AG Ratio: 1.7 (calc) (ref 1.0–2.5)
ALT: 12 U/L (ref 6–29)
AST: 16 U/L (ref 10–35)
Albumin: 4 g/dL (ref 3.6–5.1)
Alkaline phosphatase (APISO): 78 U/L (ref 37–153)
Bilirubin, Direct: 0.2 mg/dL (ref 0.0–0.2)
Globulin: 2.3 g/dL (calc) (ref 1.9–3.7)
Indirect Bilirubin: 0.5 mg/dL (calc) (ref 0.2–1.2)
Total Bilirubin: 0.7 mg/dL (ref 0.2–1.2)
Total Protein: 6.3 g/dL (ref 6.1–8.1)

## 2019-06-23 LAB — LIPID PANEL
Cholesterol: 137 mg/dL (ref <200)
HDL: 67 mg/dL (ref >=50)
LDL Cholesterol (Calc): 57 mg/dL (calc)
Non-HDL Cholesterol (Calc): 70 mg/dL (calc) (ref <130)
Total CHOL/HDL Ratio: 2 (calc) (ref <5.0)
Triglycerides: 46 mg/dL (ref <150)

## 2019-06-23 LAB — TSH: TSH: 0.29 mIU/L — ABNORMAL LOW (ref 0.40–4.50)

## 2019-06-26 ENCOUNTER — Other Ambulatory Visit: Payer: 59 | Admitting: Internal Medicine

## 2019-06-28 ENCOUNTER — Ambulatory Visit: Payer: 59 | Admitting: Internal Medicine

## 2019-06-29 ENCOUNTER — Ambulatory Visit: Payer: 59 | Admitting: Internal Medicine

## 2019-07-06 ENCOUNTER — Ambulatory Visit (INDEPENDENT_AMBULATORY_CARE_PROVIDER_SITE_OTHER): Payer: BC Managed Care – PPO | Admitting: Internal Medicine

## 2019-07-06 ENCOUNTER — Other Ambulatory Visit: Payer: Self-pay

## 2019-07-06 ENCOUNTER — Encounter: Payer: Self-pay | Admitting: Internal Medicine

## 2019-07-06 VITALS — BP 108/70 | HR 74 | Ht 62.0 in | Wt 155.3 lb

## 2019-07-06 DIAGNOSIS — E039 Hypothyroidism, unspecified: Secondary | ICD-10-CM

## 2019-07-06 MED ORDER — ALPRAZOLAM 0.5 MG PO TABS: 0.5000 mg | ORAL_TABLET | Freq: Three times a day (TID) | ORAL | 0 refills | Status: DC | PRN

## 2019-07-07 LAB — TSH: TSH: 0.32 mIU/L — ABNORMAL LOW (ref 0.40–4.50)

## 2019-07-09 ENCOUNTER — Other Ambulatory Visit: Payer: Self-pay

## 2019-07-09 MED ORDER — LEVOTHYROXINE SODIUM 50 MCG PO TABS: 50.0000 ug | ORAL_TABLET | Freq: Every day | ORAL | 1 refills | Status: DC

## 2019-08-01 NOTE — Progress Notes (Signed)
° °  Subjective:    Patient ID: Donna Jester, MD, female    DOB: Jul 01, 1955, 64 y.o.   MRN: [id number]  HPI 64 year old Female seen today for follow-up on low TSH.  When she was seen in May TSH was low at 0.29.  TSH is still low at 0.32.  Levothyroxine dose will be changed to 50 mcg daily.  Previously was on 75 mcg daily.  History of essential hypertension, GE reflux, hypothyroidism and hyperlipidemia.  Is enjoying spending time in [place] with daughter, son-in-law and grandson.  Husband has been doing well status post CABG last year. Mother age 45 doing okay living alone nearby.  Patient is enjoying being retired.  Walking regularly.  Watching diet.  Feels much better than she did when she was working.     Review of Systems see above no new complaints     Objective:   Physical Exam Blood pressure 108/70 pulse 74 pulse oximetry 97% weight 155 pounds BMI 28.41.  Has lost 6 pounds since November 2020 and has lost 28 pounds since November 2019.  No thyromegaly.       Assessment & Plan:  Low TSH-dose of thyroid replacement will be reduced to 0.05 mg daily.  Follow-up in December.

## 2019-08-01 NOTE — Patient Instructions (Signed)
Thyroid replacement dose reduced to 0.05 mg daily.  Follow-up in December.

## 2019-08-03 DIAGNOSIS — H40033 Anatomical narrow angle, bilateral: Secondary | ICD-10-CM | POA: Diagnosis not present

## 2019-08-03 DIAGNOSIS — H5203 Hypermetropia, bilateral: Secondary | ICD-10-CM | POA: Diagnosis not present

## 2019-08-08 ENCOUNTER — Other Ambulatory Visit: Payer: Self-pay | Admitting: Internal Medicine

## 2019-09-14 ENCOUNTER — Telehealth: Payer: Self-pay | Admitting: Internal Medicine

## 2019-09-14 NOTE — Telephone Encounter (Addendum)
Donna Reynolds [phone]  Clair Gulling called to say he would like come by and pick up a hard copy of Donna Reynolds below medication to send out to pharmacy.  Fluticasone-Salmeterol (ADVAIR DISKUS) 250-50 MCG/DOSE AEPB   Written with prn one year refills as requested. MJB,MD

## 2019-12-13 ENCOUNTER — Telehealth: Payer: Self-pay | Admitting: Internal Medicine

## 2019-12-13 MED ORDER — CITALOPRAM HYDROBROMIDE 10 MG PO TABS: 10.0000 mg | ORAL_TABLET | Freq: Every day | ORAL | 3 refills | Status: DC

## 2019-12-13 NOTE — Telephone Encounter (Signed)
Done

## 2019-12-13 NOTE — Telephone Encounter (Signed)
Refill #90 with 3 refills to new pharmacy

## 2019-12-13 NOTE — Telephone Encounter (Signed)
Donna Reynolds [phone]  Ota called to say she is changing pharmacies, she does not want to deal with mail in pharmacy anymore so she would like below [facility].  citalopram (CELEXA) 10 MG tablet

## 2020-01-04 ENCOUNTER — Other Ambulatory Visit: Payer: BC Managed Care – PPO | Admitting: Internal Medicine

## 2020-01-04 ENCOUNTER — Other Ambulatory Visit: Payer: Self-pay

## 2020-01-04 DIAGNOSIS — E7849 Other hyperlipidemia: Secondary | ICD-10-CM | POA: Diagnosis not present

## 2020-01-04 DIAGNOSIS — Z1231 Encounter for screening mammogram for malignant neoplasm of breast: Secondary | ICD-10-CM | POA: Diagnosis not present

## 2020-01-04 DIAGNOSIS — I10 Essential (primary) hypertension: Secondary | ICD-10-CM

## 2020-01-04 DIAGNOSIS — E785 Hyperlipidemia, unspecified: Secondary | ICD-10-CM | POA: Diagnosis not present

## 2020-01-04 DIAGNOSIS — Z0000 Encounter for general adult medical examination without abnormal findings: Secondary | ICD-10-CM

## 2020-01-04 DIAGNOSIS — E039 Hypothyroidism, unspecified: Secondary | ICD-10-CM | POA: Diagnosis not present

## 2020-01-05 LAB — CBC WITH DIFFERENTIAL/PLATELET
Absolute Monocytes: 513 cells/uL (ref 200–950)
Basophils Absolute: 81 cells/uL (ref 0–200)
Basophils Relative: 1.5 %
Eosinophils Absolute: 151 cells/uL (ref 15–500)
Eosinophils Relative: 2.8 %
HCT: 40.7 % (ref 35.0–45.0)
Hemoglobin: 13.3 g/dL (ref 11.7–15.5)
Lymphs Abs: 2030 cells/uL (ref 850–3900)
MCH: 31.7 pg (ref 27.0–33.0)
MCHC: 32.7 g/dL (ref 32.0–36.0)
MCV: 97.1 fL (ref 80.0–100.0)
MPV: 11.1 fL (ref 7.5–12.5)
Monocytes Relative: 9.5 %
Neutro Abs: 2624 cells/uL (ref 1500–7800)
Neutrophils Relative %: 48.6 %
Platelets: 224 10*3/uL (ref 140–400)
RBC: 4.19 10*6/uL (ref 3.80–5.10)
RDW: 12.7 % (ref 11.0–15.0)
Total Lymphocyte: 37.6 %
WBC: 5.4 10*3/uL (ref 3.8–10.8)

## 2020-01-05 LAB — COMPLETE METABOLIC PANEL WITH GFR
AG Ratio: 1.5 (calc) (ref 1.0–2.5)
ALT: 11 U/L (ref 6–29)
AST: 16 U/L (ref 10–35)
Albumin: 4 g/dL (ref 3.6–5.1)
Alkaline phosphatase (APISO): 65 U/L (ref 37–153)
BUN: 22 mg/dL (ref 7–25)
CO2: 31 mmol/L (ref 20–32)
Calcium: 9.7 mg/dL (ref 8.6–10.4)
Chloride: 104 mmol/L (ref 98–110)
Creat: 0.81 mg/dL (ref 0.50–0.99)
GFR, Est African American: 89 mL/min/{1.73_m2} (ref >=60)
GFR, Est Non African American: 77 mL/min/{1.73_m2} (ref >=60)
Globulin: 2.6 g/dL (calc) (ref 1.9–3.7)
Glucose, Bld: 94 mg/dL (ref 65–99)
Potassium: 4.7 mmol/L (ref 3.5–5.3)
Sodium: 141 mmol/L (ref 135–146)
Total Bilirubin: 0.7 mg/dL (ref 0.2–1.2)
Total Protein: 6.6 g/dL (ref 6.1–8.1)

## 2020-01-05 LAB — LIPID PANEL
Cholesterol: 145 mg/dL (ref <200)
HDL: 65 mg/dL (ref >=50)
LDL Cholesterol (Calc): 66 mg/dL (calc)
Non-HDL Cholesterol (Calc): 80 mg/dL (calc) (ref <130)
Total CHOL/HDL Ratio: 2.2 (calc) (ref <5.0)
Triglycerides: 65 mg/dL (ref <150)

## 2020-01-05 LAB — TSH: TSH: 1.81 mIU/L (ref 0.40–4.50)

## 2020-01-18 ENCOUNTER — Ambulatory Visit (INDEPENDENT_AMBULATORY_CARE_PROVIDER_SITE_OTHER): Payer: BC Managed Care – PPO | Admitting: Internal Medicine

## 2020-01-18 ENCOUNTER — Other Ambulatory Visit: Payer: Self-pay

## 2020-01-18 ENCOUNTER — Encounter: Payer: Self-pay | Admitting: Internal Medicine

## 2020-01-18 VITALS — BP 120/80 | HR 90 | Ht 62.0 in | Wt 160.0 lb

## 2020-01-18 DIAGNOSIS — Z0000 Encounter for general adult medical examination without abnormal findings: Secondary | ICD-10-CM

## 2020-01-18 DIAGNOSIS — Z8659 Personal history of other mental and behavioral disorders: Secondary | ICD-10-CM | POA: Diagnosis not present

## 2020-01-18 DIAGNOSIS — L719 Rosacea, unspecified: Secondary | ICD-10-CM | POA: Insufficient documentation

## 2020-01-18 DIAGNOSIS — I10 Essential (primary) hypertension: Secondary | ICD-10-CM

## 2020-01-18 DIAGNOSIS — E785 Hyperlipidemia, unspecified: Secondary | ICD-10-CM

## 2020-01-18 DIAGNOSIS — Z8709 Personal history of other diseases of the respiratory system: Secondary | ICD-10-CM

## 2020-01-18 DIAGNOSIS — E039 Hypothyroidism, unspecified: Secondary | ICD-10-CM

## 2020-01-18 DIAGNOSIS — J309 Allergic rhinitis, unspecified: Secondary | ICD-10-CM

## 2020-01-18 LAB — POCT URINALYSIS DIPSTICK
Bilirubin, UA: NEGATIVE
Glucose, UA: NEGATIVE
Ketones, UA: NEGATIVE
Leukocytes, UA: NEGATIVE
Nitrite, UA: NEGATIVE
Protein, UA: NEGATIVE
Spec Grav, UA: 1.015 (ref 1.010–1.025)
Urobilinogen, UA: 0.2 E.U./dL
pH, UA: 6 (ref 5.0–8.0)

## 2020-01-18 MED ORDER — LEVOTHYROXINE SODIUM 50 MCG PO TABS
50.0000 ug | ORAL_TABLET | Freq: Every day | ORAL | 1 refills | Status: DC
Start: 2020-01-18 — End: 2020-06-07

## 2020-01-18 MED ORDER — LOSARTAN POTASSIUM-HCTZ 50-12.5 MG PO TABS
1.0000 | ORAL_TABLET | Freq: Every day | ORAL | 3 refills | Status: DC
Start: 2020-01-18 — End: 2020-01-23

## 2020-01-18 MED ORDER — CITALOPRAM HYDROBROMIDE 10 MG PO TABS
10.0000 mg | ORAL_TABLET | Freq: Every day | ORAL | 3 refills | Status: DC
Start: 2020-01-18 — End: 2021-01-14

## 2020-01-18 MED ORDER — PRAVASTATIN SODIUM 40 MG PO TABS
40.0000 mg | ORAL_TABLET | Freq: Every day | ORAL | 3 refills | Status: DC
Start: 2020-01-18 — End: 2021-01-14

## 2020-01-18 NOTE — Progress Notes (Signed)
Subjective:    Patient ID: Donna Jester, MD, female    DOB: Jan 24, 1956, 64 y.o.   MRN: [id number]  HPI  64 year old Female for health maintenance exam and evaluation of medical issues.  Dr. Sabra Heck is GYN physician.  She has a history of hypertension, hyperlipidemia, allergic rhinitis, asthma, GE reflux and hypothyroidism.  In May 2015 she had an episode of postmenopausal bleeding and ultrasound showed a new left ovarian cyst which appeared to be benign. History of endometrial hyperplasia without atypia treated in March 2013 with D&C followed by Mirena IUD. Repeat biopsy done in September 2014 was negative. Mirena has been removed. No further episodes of bleeding.  History of allergic rhinitis tested by Dr. Donneta Romberg in the past and was found to have positive skin tests to house dust, dust mites, cockroach, mold mix, slight reactions to grasses. Not positive to trees, dog, cat, or feathers. Spirometry was normal. Uses inhalers as needed. Sometimes has wheezing.  In 2011 had sciatica of the right leg for which tramadol was prescribed.  Occasionally has dependent edema for which Lasix has been prescribed on an as-needed basis.  History of benign breast biopsy in the past.  Had cardiac evaluation February 2012 for chest pain including cardiac cath which was negative.  History of herpes zoster 2006.  Patient had endoscopy and colonoscopy by Dr. Collene Mares in 2009. Endoscopy was done at the time because of melena. She had been taking a lot of ibuprofen for dental problems. Endoscopy showed gastric polyps.  History of left lower lobe pneumonia in August 2006. Had Pneumovax in 1998. History of diverticulitis in 2001.  Had positive measles titer in 1999. Hospitalized for chickenpox in the 23s. Had mononucleosis in the 1990s. Laparoscopic cholecystectomy in March 1996.  History of migraine headaches. 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 Tried Topamax in the past by Dr. Jannifer Franklin. 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  Social history: She is a retired Consulting civil engineer. Worked for [employer] prior to her retirement. Spends time with new grandchild, all of her, in [place] area. She is a native of [place]. She attended [employer]. She is married to a retired Software engineer. Parents immigrated from [place]. She is an only child. Non-smoker. Social alcohol consumption. 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 Mother resides in [place].  Family history: Father died of complications of congestive heart failure, had history of chronic kidney disease and history of bladder cancer. Mother with history of hypertension and hyperlipidemia doing well in her 10s residing alone near her daughter.   Has had COVID-19 immunizations. Tetanus immunization is up-to-date. Had high-dose flu vaccine in October 2021.   Review of Systems  Constitutional: Negative.   Respiratory: Negative.   Cardiovascular: Negative.   Gastrointestinal: Negative.   Genitourinary: Negative.   Neurological: Negative.        Objective:   Physical Exam Blood pressure 120/80, pulse 98 regular, pulse oximetry 98% weight 160 pounds height 5 feet 2 inches BMI 29.26  Skin is warm and dry. No cervical adenopathy. TMs are clear. Neck is supple. No thyromegaly. No carotid bruits. Chest is clear to auscultation. Cardiac exam regular rate and rhythm normal S1 and S2. Breast without masses. Abdomen is soft nondistended without hepatosplenomegaly masses or tenderness. GYN exam deferred to Dr. Edwinna Areola. Neuro: Intact without focal deficits. No lower extremity  pitting edema. Affect felt judgment are normal.       Assessment & Plan:  CBC, c-Met, lipid panel, TSH and dipstick UA are all within normal limits. Vitamin D level not done due to expense  Essential  hypertension-stable on low-dose losartan  Hypothyroidism-TSH within normal limits on levothyroxine 50 mcg daily  Hyperlipidemia-lipid panel stable on Pravachol 40 mg daily  History of GE reflux treated with Nexium  History of anxiety and depression treated with Xanax and Celexa and stable.  Plan: Continue current medications and follow-up in 6 months.

## 2020-01-23 ENCOUNTER — Other Ambulatory Visit: Payer: Self-pay | Admitting: Internal Medicine

## 2020-03-02 NOTE — Patient Instructions (Addendum)
It was a pleasure to see you today. Lab work is entirely within normal limits. Return in 6 months or as needed. No change in medications.

## 2020-03-31 ENCOUNTER — Telehealth: Payer: Self-pay | Admitting: Internal Medicine

## 2020-03-31 MED ORDER — FLUTICASONE-SALMETEROL 250-50 MCG/DOSE IN AEPB
1.0000 | INHALATION_SPRAY | Freq: Two times a day (BID) | RESPIRATORY_TRACT | 3 refills | Status: DC
Start: 2020-03-31 — End: 2020-07-30

## 2020-03-31 NOTE — Telephone Encounter (Signed)
Refill x one year °

## 2020-03-31 NOTE — Telephone Encounter (Signed)
Sherrol Vicars [phone]  Koriana wants to start getting below medication at below pharmacy instead of [place] pharmacy.  Fluticasone-Salmeterol (ADVAIR DISKUS) 250-50 MCG/DOSE AEPB  [facility] [id number] [facility] [address], [place] Phone:  [phone]  Fax:  [phone]

## 2020-03-31 NOTE — Telephone Encounter (Signed)
It will not let me send, please sign.

## 2020-04-07 NOTE — Addendum Note (Signed)
Addended by: Mady Haagensen on: 04/07/2020 09:44 AM   Modules accepted: Orders

## 2020-05-09 ENCOUNTER — Other Ambulatory Visit: Payer: Self-pay | Admitting: *Deleted

## 2020-05-09 NOTE — Patient Outreach (Signed)
[facility]) Care Management  05/09/2020  Lyda Jester, MD Dec 24, [id number]   RN Health Coach attempted follow up outreach call to patient.  Patient was unavailable. HIPPA compliance voicemail message left with return callback number.  Plan: RN will call patient again within 10 days.  [facility] [phone]

## 2020-05-17 ENCOUNTER — Other Ambulatory Visit: Payer: Self-pay | Admitting: Internal Medicine

## 2020-05-23 ENCOUNTER — Other Ambulatory Visit: Payer: Self-pay

## 2020-05-23 ENCOUNTER — Other Ambulatory Visit: Payer: BC Managed Care – PPO | Admitting: Internal Medicine

## 2020-05-23 DIAGNOSIS — E785 Hyperlipidemia, unspecified: Secondary | ICD-10-CM

## 2020-05-23 DIAGNOSIS — E039 Hypothyroidism, unspecified: Secondary | ICD-10-CM

## 2020-05-23 DIAGNOSIS — I10 Essential (primary) hypertension: Secondary | ICD-10-CM

## 2020-05-23 DIAGNOSIS — E7849 Other hyperlipidemia: Secondary | ICD-10-CM | POA: Diagnosis not present

## 2020-05-24 LAB — HEPATIC FUNCTION PANEL
AG Ratio: 1.8 (calc) (ref 1.0–2.5)
ALT: 13 U/L (ref 6–29)
AST: 18 U/L (ref 10–35)
Albumin: 4.2 g/dL (ref 3.6–5.1)
Alkaline phosphatase (APISO): 70 U/L (ref 37–153)
Bilirubin, Direct: 0.2 mg/dL (ref 0.0–0.2)
Globulin: 2.4 g/dL (calc) (ref 1.9–3.7)
Indirect Bilirubin: 0.5 mg/dL (calc) (ref 0.2–1.2)
Total Bilirubin: 0.7 mg/dL (ref 0.2–1.2)
Total Protein: 6.6 g/dL (ref 6.1–8.1)

## 2020-05-24 LAB — LIPID PANEL
Cholesterol: 143 mg/dL (ref <200)
HDL: 71 mg/dL (ref >=50)
LDL Cholesterol (Calc): 58 mg/dL (calc)
Non-HDL Cholesterol (Calc): 72 mg/dL (calc) (ref <130)
Total CHOL/HDL Ratio: 2 (calc) (ref <5.0)
Triglycerides: 63 mg/dL (ref <150)

## 2020-05-24 LAB — TSH: TSH: 3.06 mIU/L (ref 0.40–4.50)

## 2020-05-30 ENCOUNTER — Ambulatory Visit: Payer: BC Managed Care – PPO | Admitting: Obstetrics & Gynecology

## 2020-06-07 ENCOUNTER — Other Ambulatory Visit: Payer: Self-pay | Admitting: Internal Medicine

## 2020-06-20 ENCOUNTER — Other Ambulatory Visit: Payer: BC Managed Care – PPO | Admitting: Internal Medicine

## 2020-07-03 NOTE — Progress Notes (Signed)
65 y.o. [id number] Married White or Caucasian female here for annual exam.  Has been with her daughter a lot more this year.  She is expecting a baby girl.  She is doing well.  Baby has a velamentous cord insertion.  She is being monitored closely.    Denies vaginal bleeding.  H/o simple endometrial hyperplasia 2/13.  Had IUD placed 3/13.  Follow up biopsy 9/13.  Current guidelines reviewed.  BMI 29.  Do not feel she needs progestins.  Patient's last menstrual period was 06/01/2013.          Sexually active: No.  The current method of family planning is post menopausal status.    Exercising: Yes.    walking 2 miles daily Smoker:  no  Health Maintenance: Pap:  03/01/2019 Negative  History of abnormal Pap:  no MMG:  01/03/2020 Negative Colonoscopy:  2009 last year.  Pt aware this is due and has been trying to get this scheduled BMD:   03/07/2019 TDaP:  2019 Pneumonia vaccine(s): will do with Dr. Renold Genta Shingrix:   considered Hep C testing: 12/19/2017 Screening Labs: PCP   reports that she has never smoked. She has never used smokeless tobacco. She reports that she does not drink alcohol and does not use drugs.  Past Medical History:  Diagnosis Date  . Anxiety   . Asthma   . Dyslipidemia   . GERD (gastroesophageal reflux disease)   . HTN (hypertension)   . Hypothyroidism     Past Surgical History:  Procedure Laterality Date  . BREAST SURGERY Left 2000   Bening  . CARDIAC CATHETERIZATION    . CHOLECYSTECTOMY    . DIAGNOSTIC LAPAROSCOPY    . DILATION AND CURETTAGE OF UTERUS  04/12/2011   Procedure: DILATATION AND CURETTAGE;  Surgeon: Megan Salon, MD;  Location: [facility];  Service: Gynecology;  Laterality: N/A;  . INTRAUTERINE DEVICE INSERTION  04/19/11   Mirena    Current Outpatient Medications  Medication Sig Dispense Refill  . acetaminophen (TYLENOL) 500 MG tablet Take 1,000 mg by mouth as needed. For pain     . cholecalciferol (VITAMIN D) 1000 UNITS tablet Take 1,000 Units  by mouth daily.    . citalopram (CELEXA) 10 MG tablet Take 1 tablet (10 mg total) by mouth daily. 90 tablet 3  . esomeprazole (NEXIUM) 20 MG capsule Take 20 mg by mouth daily at 12 noon.    . Fluticasone-Salmeterol (ADVAIR DISKUS) 250-50 MCG/DOSE AEPB Inhale 1 puff into the lungs 2 (two) times daily. 180 each 3  . levothyroxine (SYNTHROID) 50 MCG tablet TAKE 1 TABLET BY MOUTH EVERY DAY 90 tablet 1  . losartan (COZAAR) 25 MG tablet TAKE 2 TABLETS BY MOUTH EVERY DAY (Patient taking differently: TAKE 1 TABLETS BY MOUTH EVERY DAY) 180 tablet 1  . Omega-3 1000 MG CAPS     . pravastatin (PRAVACHOL) 40 MG tablet Take 1 tablet (40 mg total) by mouth at bedtime. 90 tablet 3  . ALPRAZolam (XANAX) 0.5 MG tablet Take 1 tablet (0.5 mg total) by mouth 3 (three) times daily as needed for anxiety. (Patient not taking: Reported on 07/04/2020) 30 tablet 0   No current facility-administered medications for this visit.    Family History  Problem Relation Age of Onset  . Coronary artery disease Father   . COPD Father   . Cancer Father        Bladder  . Hyperlipidemia Mother   . Hypertension Mother     Review of Systems  All other systems reviewed and are negative.   Exam:   BP 121/81   Pulse 79   Resp 18   Ht 5\' 2"  (1.575 m)   Wt 161 lb (73 kg)   LMP 06/01/2013 Comment: spotting  BMI 29.45 kg/m   Height: 5\' 2"  (157.5 cm)  General appearance: alert, cooperative and appears stated age Head: Normocephalic, without obvious abnormality, atraumatic Neck: no adenopathy, supple, symmetrical, trachea midline and thyroid normal to inspection and palpation Lungs: clear to auscultation bilaterally Breasts: normal appearance, no masses or tenderness Heart: regular rate and rhythm Abdomen: soft, non-tender; bowel sounds normal; no masses,  no organomegaly Extremities: extremities normal, atraumatic, no cyanosis or edema Skin: Skin color, texture, turgor normal. No rashes or lesions Lymph nodes: Cervical,  supraclavicular, and axillary nodes normal. No abnormal inguinal nodes palpated Neurologic: Grossly normal   Pelvic: External genitalia:  no lesions              Urethra:  normal appearing urethra with no masses, tenderness or lesions              Bartholins and Skenes: normal                 Vagina: normal appearing vagina with normal color and no discharge, no lesions, incomplete uterine prolapse with grade 2 cystocele              Cervix: no lesions              Pap taken: No. Bimanual Exam:  Uterus:  normal size, contour, position, consistency, mobility, non-tender              Adnexa: normal adnexa and no mass, fullness, tenderness               Rectovaginal: Confirms               Anus:  normal sphincter tone, no lesions  Chaperone, Octaviano Batty, CMA, was present for exam.  Assessment/Plan: 1. Well woman exam with routine gynecological exam - Pap smear  03/01/2019 - MMG 01/03/2020, requested copy - BMD 03/07/2019 - Vaccines updated - lab work done with Dr. Renold Genta  2. Simple endometrial hyperplasia, hx - current recommendations reviewed.  Pt had Mirena IUD placed with subsequent normal endometrial biopsy.  Progestin therapy recommended for women with risks for endometrial cancer.  BMI is <30, feel ok to monitor and not treat with progestins.  Pt knows to call with any future bleeding  3. Postmenopausal - no HRT  4. Incomplete uterine prolapse - stable on exam  5. History of stress incontinence

## 2020-07-04 ENCOUNTER — Encounter: Payer: Self-pay | Admitting: Internal Medicine

## 2020-07-04 ENCOUNTER — Ambulatory Visit (INDEPENDENT_AMBULATORY_CARE_PROVIDER_SITE_OTHER): Payer: BC Managed Care – PPO | Admitting: Obstetrics & Gynecology

## 2020-07-04 ENCOUNTER — Other Ambulatory Visit: Payer: Self-pay

## 2020-07-04 ENCOUNTER — Encounter (HOSPITAL_BASED_OUTPATIENT_CLINIC_OR_DEPARTMENT_OTHER): Payer: Self-pay | Admitting: Obstetrics & Gynecology

## 2020-07-04 ENCOUNTER — Ambulatory Visit (INDEPENDENT_AMBULATORY_CARE_PROVIDER_SITE_OTHER): Payer: BC Managed Care – PPO | Admitting: Internal Medicine

## 2020-07-04 VITALS — BP 121/81 | HR 79 | Resp 18 | Ht 62.0 in | Wt 161.0 lb

## 2020-07-04 VITALS — BP 120/60 | HR 80 | Ht 62.0 in | Wt 166.0 lb

## 2020-07-04 DIAGNOSIS — Z780 Asymptomatic menopausal state: Secondary | ICD-10-CM | POA: Diagnosis not present

## 2020-07-04 DIAGNOSIS — N812 Incomplete uterovaginal prolapse: Secondary | ICD-10-CM

## 2020-07-04 DIAGNOSIS — E785 Hyperlipidemia, unspecified: Secondary | ICD-10-CM

## 2020-07-04 DIAGNOSIS — E039 Hypothyroidism, unspecified: Secondary | ICD-10-CM

## 2020-07-04 DIAGNOSIS — I10 Essential (primary) hypertension: Secondary | ICD-10-CM

## 2020-07-04 DIAGNOSIS — Z87448 Personal history of other diseases of urinary system: Secondary | ICD-10-CM

## 2020-07-04 DIAGNOSIS — Z8659 Personal history of other mental and behavioral disorders: Secondary | ICD-10-CM

## 2020-07-04 DIAGNOSIS — Z01419 Encounter for gynecological examination (general) (routine) without abnormal findings: Secondary | ICD-10-CM | POA: Diagnosis not present

## 2020-07-04 DIAGNOSIS — Z8709 Personal history of other diseases of the respiratory system: Secondary | ICD-10-CM | POA: Diagnosis not present

## 2020-07-04 DIAGNOSIS — N8501 Benign endometrial hyperplasia: Secondary | ICD-10-CM | POA: Diagnosis not present

## 2020-07-04 NOTE — Progress Notes (Signed)
   Subjective:    Patient ID: Lyda Jester, MD, female    DOB: 1955/02/26, 65 y.o.   MRN: [id number]  HPI 65 year old Female seen for 6 month recheck.  History of allergic rhinitis, asthma, dyslipidemia, hypertension, GE reflux, stress urinary incontinence, hypothyroidism.  Medical issues are under excellent control.  She is enjoying retirement and spending time with her grandson.  Daughter is expecting another baby very soon.  Patient also saw her GYN physician Dr. Edwinna Areola today.  Patient's last menstrual period was May 2015.  She is a non-smoker.  She walks quite a bit generally a couple of miles or so daily.  Tetanus immunization is up-to-date.  Gets annual influenza vaccine.  Has had COVID-19 immunizations.  Last one was in April.    Review of Systems no new complaints     Objective:   Physical Exam   BP 120/60, pulse 80 , Pulse ox 97%, Weight 166 pounds, BMI 30.36      Assessment & Plan:  Essential hypertension-stable on low-dose losartan 25 mg daily  Hypothyroidism treated with levothyroxine 50 mcg daily.  TSH is 3.06 and will continue to be monitored.  Would like to see  TSH around 1.  History of GE reflux treated with Nexium 20 mg daily  History of anxiety treated with as needed Xanax 0.5 mg which she takes sparingly.  History of hyperlipidemia treated with Pravachol 40 mg daily.  Lipid panel is entirely normal.  Liver functions are normal.  History of mild anxiety/depression treated with Celexa 10 mg daily and stable  History of mild asthma treated with Advair discus 250

## 2020-07-08 ENCOUNTER — Encounter (HOSPITAL_BASED_OUTPATIENT_CLINIC_OR_DEPARTMENT_OTHER): Payer: Self-pay | Admitting: Obstetrics & Gynecology

## 2020-07-26 NOTE — Patient Instructions (Signed)
It was a pleasure to see you today.  Continue current medications and follow-up in 6 months.  Lipid panel is entirely normal.  We will continue to monitor TSH which is currently 3.06 and perhaps we can increase the dose if TSH does not change at next visit.  Ideally it should be around 1.00.  Blood pressure is stable on low-dose losartan.  Continue Advair Diskus as needed for mild asthma.  Continue Celexa daily and Xanax as needed.

## 2020-07-30 ENCOUNTER — Telehealth: Payer: BC Managed Care – PPO | Admitting: Internal Medicine

## 2020-07-30 ENCOUNTER — Encounter: Payer: Self-pay | Admitting: Internal Medicine

## 2020-07-30 MED ORDER — ADVAIR HFA 230-21 MCG/ACT IN AERO: 2.0000 | INHALATION_SPRAY | Freq: Two times a day (BID) | RESPIRATORY_TRACT | 12 refills | Status: DC

## 2020-07-30 NOTE — Telephone Encounter (Signed)
Refill Advair x one year

## 2020-08-01 ENCOUNTER — Telehealth: Payer: Self-pay | Admitting: Internal Medicine

## 2020-08-01 NOTE — Telephone Encounter (Signed)
Spoke with pharmacist and they will change future Rx to [employer]. MJB, MD

## 2020-08-01 NOTE — Telephone Encounter (Signed)
[facility] [address]. Patient requests [place]. Have left message for pharmacist to call me back

## 2020-08-15 DIAGNOSIS — H40033 Anatomical narrow angle, bilateral: Secondary | ICD-10-CM | POA: Diagnosis not present

## 2020-08-15 DIAGNOSIS — H524 Presbyopia: Secondary | ICD-10-CM | POA: Diagnosis not present

## 2020-08-15 DIAGNOSIS — H5203 Hypermetropia, bilateral: Secondary | ICD-10-CM | POA: Diagnosis not present

## 2020-10-18 ENCOUNTER — Other Ambulatory Visit: Payer: Self-pay | Admitting: Internal Medicine

## 2020-12-10 ENCOUNTER — Emergency Department
Admission: EM | Admit: 2020-12-10 | Discharge: 2020-12-10 | Disposition: A | Discharge status: Home / Self Care | Payer: Medicare (Managed Care) | Attending: Emergency Medicine | Admitting: Emergency Medicine

## 2020-12-10 ENCOUNTER — Emergency Department: Payer: Medicare (Managed Care)

## 2020-12-10 DIAGNOSIS — J189 Pneumonia, unspecified organism: Secondary | ICD-10-CM | POA: Insufficient documentation

## 2020-12-10 DIAGNOSIS — Z20822 Contact with and (suspected) exposure to covid-19: Secondary | ICD-10-CM | POA: Insufficient documentation

## 2020-12-10 DIAGNOSIS — R062 Wheezing: Secondary | ICD-10-CM | POA: Diagnosis not present

## 2020-12-10 DIAGNOSIS — R051 Acute cough: Secondary | ICD-10-CM | POA: Diagnosis not present

## 2020-12-10 DIAGNOSIS — R0602 Shortness of breath: Secondary | ICD-10-CM | POA: Diagnosis not present

## 2020-12-10 HISTORY — DX: Anxiety disorder, unspecified: F41.9

## 2020-12-10 HISTORY — DX: Essential (primary) hypertension: I10

## 2020-12-10 HISTORY — DX: Hypothyroidism, unspecified: E03.9

## 2020-12-10 HISTORY — DX: Unspecified asthma, uncomplicated: J45.909

## 2020-12-10 HISTORY — DX: Hyperlipidemia, unspecified: E78.5

## 2020-12-10 LAB — CBC AND DIFFERENTIAL
Absolute NRBC: 0 10*3/uL (ref 0.00–0.00)
Basophils Absolute Automated: 0.04 10*3/uL (ref 0.00–0.08)
Basophils Automated: 0.3 %
Eosinophils Absolute Automated: 0 10*3/uL (ref 0.00–0.44)
Eosinophils Automated: 0 %
Hematocrit: 44 % — ABNORMAL HIGH (ref 34.7–43.7)
Hgb: 14.6 g/dL (ref 11.4–14.8)
Immature Granulocytes Absolute: 0.08 10*3/uL — ABNORMAL HIGH (ref 0.00–0.07)
Immature Granulocytes: 0.5 %
Lymphocytes Absolute Automated: 1 10*3/uL (ref 0.42–3.22)
Lymphocytes Automated: 6.9 %
MCH: 32.2 pg (ref 25.1–33.5)
MCHC: 33.2 g/dL (ref 31.5–35.8)
MCV: 96.9 fL — ABNORMAL HIGH (ref 78.0–96.0)
MPV: 10.9 fL (ref 8.9–12.5)
Monocytes Absolute Automated: 0.78 10*3/uL (ref 0.21–0.85)
Monocytes: 5.3 %
Neutrophils Absolute: 12.68 10*3/uL — ABNORMAL HIGH (ref 1.10–6.33)
Neutrophils: 87 %
Nucleated RBC: 0 /100 WBC (ref 0.0–0.0)
Platelets: 270 10*3/uL (ref 142–346)
RBC: 4.54 10*6/uL (ref 3.90–5.10)
RDW: 14 % (ref 11–15)
WBC: 14.58 10*3/uL — ABNORMAL HIGH (ref 3.10–9.50)

## 2020-12-10 LAB — BASIC METABOLIC PANEL
Anion Gap: 10 (ref 5.0–15.0)
BUN: 15 mg/dL (ref 7.0–21.0)
CO2: 28 mEq/L (ref 17–29)
Calcium: 9.9 mg/dL (ref 8.5–10.5)
Chloride: 103 mEq/L (ref 99–111)
Creatinine: 0.8 mg/dL (ref 0.4–1.0)
Glucose: 108 mg/dL — ABNORMAL HIGH (ref 70–100)
Potassium: 4 mEq/L (ref 3.5–5.3)
Sodium: 141 mEq/L (ref 135–145)

## 2020-12-10 LAB — COVID-19 (SARS-COV-2) & INFLUENZA  A/B, NAA (ROCHE LIAT)
Influenza A: NOT DETECTED
Influenza B: NOT DETECTED
SARS CoV 2 Overall Result: NOT DETECTED

## 2020-12-10 LAB — ECG 12-LEAD
Atrial Rate: 96 {beats}/min
IHS MUSE NARRATIVE AND IMPRESSION: NORMAL
P Axis: 52 degrees
P-R Interval: 152 ms
Q-T Interval: 346 ms
QRS Duration: 84 ms
QTC Calculation (Bezet): 437 ms
R Axis: -13 degrees
T Axis: 54 degrees
Ventricular Rate: 96 {beats}/min

## 2020-12-10 LAB — LACTIC ACID, PLASMA: Lactic Acid: 1.8 mmol/L (ref 0.2–2.0)

## 2020-12-10 LAB — GFR: EGFR: >60

## 2020-12-10 LAB — TROPONIN I: Troponin I: <0.01 ng/mL (ref 0.00–0.05)

## 2020-12-10 MED ORDER — AMOXICILLIN-POT CLAVULANATE 875-125 MG PO TABS
1.0000 | ORAL_TABLET | Freq: Once | ORAL | Status: AC
Start: 2020-12-10 — End: 2020-12-10
  Administered 2020-12-10: 1 via ORAL
  Filled 2020-12-10: qty 1

## 2020-12-10 MED ORDER — IPRATROPIUM BROMIDE 0.02 % IN SOLN
0.5000 mg | Freq: Once | RESPIRATORY_TRACT | Status: AC
Start: 2020-12-10 — End: 2020-12-10
  Administered 2020-12-10: 0.5 mg via RESPIRATORY_TRACT
  Filled 2020-12-10: qty 2.5

## 2020-12-10 MED ORDER — ALBUTEROL SULFATE (2.5 MG/3ML) 0.083% IN NEBU
5.0000 mg | INHALATION_SOLUTION | Freq: Once | RESPIRATORY_TRACT | Status: AC
Start: 2020-12-10 — End: 2020-12-10
  Administered 2020-12-10: 5 mg via RESPIRATORY_TRACT
  Filled 2020-12-10: qty 6

## 2020-12-10 MED ORDER — ALBUTEROL SULFATE (2.5 MG/3ML) 0.083% IN NEBU
2.5000 mg | INHALATION_SOLUTION | Freq: Once | RESPIRATORY_TRACT | Status: AC
Start: 2020-12-10 — End: 2020-12-10
  Administered 2020-12-10: 2.5 mg via RESPIRATORY_TRACT
  Filled 2020-12-10: qty 3

## 2020-12-10 MED ORDER — AZITHROMYCIN 250 MG PO TABS
500.0000 mg | ORAL_TABLET | Freq: Once | ORAL | Status: AC
Start: 2020-12-10 — End: 2020-12-10
  Administered 2020-12-10: 500 mg via ORAL
  Filled 2020-12-10: qty 2

## 2020-12-10 MED ORDER — AZITHROMYCIN 250 MG PO TABS
250.0000 mg | ORAL_TABLET | Freq: Every day | ORAL | 0 refills | Status: AC
Start: 2020-12-10 — End: 2020-12-14

## 2020-12-10 MED ORDER — AMOXICILLIN-POT CLAVULANATE 875-125 MG PO TABS
1.0000 | ORAL_TABLET | Freq: Two times a day (BID) | ORAL | 0 refills | Status: DC
Start: 2020-12-10 — End: 2020-12-10

## 2020-12-10 MED ORDER — DEXAMETHASONE SODIUM PHOSPHATE 4 MG/ML IJ SOLN (WRAP)
10.0000 mg | Freq: Once | INTRAMUSCULAR | Status: AC
Start: 2020-12-10 — End: 2020-12-10
  Administered 2020-12-10: 10 mg via INTRAVENOUS
  Filled 2020-12-10: qty 3

## 2020-12-10 MED ORDER — AMOXICILLIN-POT CLAVULANATE 875-125 MG PO TABS
1.0000 | ORAL_TABLET | Freq: Two times a day (BID) | ORAL | 0 refills | Status: AC
Start: 2020-12-10 — End: 2020-12-15

## 2020-12-10 NOTE — ED Provider Notes (Signed)
[facility] EMERGENCY DEPARTMENT H&P      Visit date: 12/10/2020      CLINICAL SUMMARY           Diagnosis:    .     Final diagnoses:   Pneumonia due to infectious organism, unspecified laterality, unspecified part of lung         MDM Notes:      Patient is a 65 y.o. female who presents with concerns of worsening shortness of breath and cough.  Patient with known exposure to RSV.  On initial evaluation, patients vitals were significant for tachypnea and tachycardia.  Patient with expiratory wheezing and rhonchi on right.    Differential diagnosis includes viral versus bacterial etiology such as pneumonia versus Covid virus versus influenza versus ACS versus PE versus dissection versus MSK pain     Labs obtained significant for white count of 14.58.  H&H of 14.6/44.  Creatinine within normal limits.  Troponin undetectable.  Lactic within normal limits at 1.8.  Influenza and COVID-negative.    Imaging:   Chest x-ray was read as clear.  Patient with rhonchi on right compared to left.    Rx/Intervention:  Patient given breathing treatment.  Started on antibiotics for concerns of pneumonia.*    On re-evaluation, patient wheezing improved.  Intermittent wheezing subsequently ordered.  Second albuterol treatment ordered.  Decadron provided.  On reevaluation patient's lungs are clear to auscultation except for rhonchi on the right compared to left.  Clinically I was concerned for pneumonia.  Patient with no desaturation in her O2 sats with ambulation.    Patient subsequently felt improved.  Patient requesting to go home.  Patient is a physciain.  In shared decision making we discussed plan for discharge and potential return precautions.  Patient advised that should her symptoms worsen, develop increasing shortness of breath, chest pain, dyspnea with exertion, fevers, inability to tolerate diet, vomiting, or any other concerning signs or symptoms to return immediately for reevaluation.  Patient  was agreeable and understanding with the plan.  Prescription for antibiotics provided.      Amount and/or Complexity of Data Reviewed  Clinical lab tests: ordered and reviewed  Tests in the radiology section of CPT: ordered and reviewed  Tests in the medicine section of CPT: ordered and reviewed   Review and summarize past medical records: yes I looked up the patient in our electronic medical record   Independent visualization of images, tracings, or specimens: yes  Discuss the patient with other providers:no          Disposition:      Discharge         Discharge Prescriptions       Medication Sig Dispense Auth. Provider    amoxicillin-clavulanate (AUGMENTIN) 875-125 MG per tablet  (Status: Discontinued) Take 1 tablet by mouth 2 (two) times daily for 7 days 14 tablet Britt Theard Dimitrios, MD    azithromycin (ZITHROMAX) 250 MG tablet Take 1 tablet (250 mg) by mouth daily for 4 doses 4 tablet Kimberley Dastrup Dimitrios, MD    amoxicillin-clavulanate (AUGMENTIN) 875-125 MG per tablet Take 1 tablet by mouth 2 (two) times daily for 5 days 10 tablet Kyley Laurel Dimitrios, MD                        CLINICAL INFORMATION        HPI:      Chief Complaint: Shortness of Breath and Cough  .    Kaylee  Burns is a 65 y.o. female h/o of asthma htn and hld who presents with concerns of worsening sob.  Patient's grandchildren sick with RSV.  Granddaughter required admission.  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  Feel ill on Friday with progressing cough.  Felt more short of breath and took 30 mg of prednisone yesterday.  Has been using her inhaler without relief of symptoms.  Felt worse this morning and came in for evaluation.  States that she has pains with coughing though no exertional chest pain.  Denies any nausea vomiting numbness swelling or rash.    History obtained from: Patient          ROS:      Positive and negative ROS elements as per HPI.  All  other systems reviewed and negative.      Physical Exam:      Pulse (!) 107  BP (!) 209/104  Resp (!) 28  SpO2 100 %  Temp 97.7 F (36.5 C)    Physical Exam  Constitutional:       General: She is not in acute distress.     Appearance: Normal appearance. She is not ill-appearing.   HENT:      Head: Normocephalic and atraumatic.      Mouth/Throat:      Mouth: Mucous membranes are moist.      Pharynx: No posterior oropharyngeal erythema.   Eyes:      Extraocular Movements: Extraocular movements intact.      Pupils: Pupils are equal, round, and reactive to light.   Cardiovascular:      Rate and Rhythm: Regular rhythm. Tachycardia present.      Pulses: Normal pulses.      Heart sounds: No murmur heard.  Pulmonary:      Effort: Tachypnea present.      Comments: Patient speaking in full sentences.  Tolerating her secretions.  Diffuse expiratory wheezing on examination worse at the bases.  Rhonchi noted on right lung compared to left.  Abdominal:      General: There is no distension.      Palpations: Abdomen is soft.      Tenderness: There is no abdominal tenderness.   Lymphadenopathy:      Cervical: Cervical adenopathy (b/l) present.   Skin:     General: Skin is warm.      Capillary Refill: Capillary refill takes less than 2 seconds.   Neurological:      General: No focal deficit present.      Mental Status: She is alert and oriented to person, place, and time. Mental status is at baseline.   Psychiatric:         Mood and Affect: Mood normal.               PAST HISTORY        Primary Care Provider: Patsy Lager, MD        PMH/PSH:    .     Past Medical History:   Diagnosis Date    Anxiety     Asthma     Hyperlipidemia     Hypertension     Hypothyroid        She has a past surgical history that includes Cholecystectomy.      Social/Family History:      She reports that she has never smoked. She has never used smokeless tobacco. She reports that she does not drink alcohol and does not  use drugs.    History reviewed.  No pertinent family history.      Listed Medications on Arrival:    .     Home Medications       Med List Status: Complete Set By: Rickey Primus, RN at 12/10/2020  6:52 AM              citalopram (CeleXA) 10 MG tablet     Take 1 tablet (10 mg) by mouth daily     fluticasone-salmeterol (ADVAIR DISKUS) 250-50 MCG/ACT Aerosol Pwdr, Breath Activated     Inhale 1 puff into the lungs     levothyroxine (Synthroid) 50 MCG tablet     Take 1 tablet (50 mcg) by mouth Once a day at 6:00am     losartan (COZAAR) 25 MG tablet     Take 1 tablet (25 mg) by mouth daily     pravastatin (PRAVACHOL) 40 MG tablet     Take 1 tablet (40 mg) by mouth daily     vitamin D (CHOLECALCIFEROL) 25 MCG (1000 UT) tablet     Take 1 tablet (1,000 Units) by mouth daily           Allergies: She has No Known Allergies.            VISIT INFORMATION        Clinical Course in the ED:                 Medications Given in the ED:    .     ED Medication Orders (From admission, onward)      Start Ordered     Status Ordering Provider    12/10/20 0815 12/10/20 0814  dexAMETHasone (DECADRON) injection 10 mg  Once        Route: Intravenous  Ordered Dose: 10 mg     Last MAR action: Given Marletta Bousquet DIMITRIOS    12/10/20 0815 12/10/20 0814  albuterol (PROVENTIL) (2.5 MG/3ML) 0.083% nebulizer solution 2.5 mg  RT - Once        Route: Nebulization  Ordered Dose: 2.5 mg     Last MAR action: Given Reyli Schroth DIMITRIOS    12/10/20 0815 12/10/20 0814  amoxicillin-clavulanate (AUGMENTIN) 875-125 MG per tablet 1 tablet  Once        Route: Oral  Ordered Dose: 1 tablet     Last MAR action: Given Agnes Brightbill DIMITRIOS    12/10/20 0815 12/10/20 0814  azithromycin (ZITHROMAX) tablet 500 mg  Once        Route: Oral  Ordered Dose: 500 mg     Last MAR action: Given Yadiel Aubry DIMITRIOS    12/10/20 0700 12/10/20 0659  albuterol (PROVENTIL) (2.5 MG/3ML) 0.083% nebulizer solution 5 mg  RT - Once        Route: Nebulization  Ordered  Dose: 5 mg     Last MAR action: Given Kaoir Loree DIMITRIOS    12/10/20 0700 12/10/20 0659  ipratropium (ATROVENT) 0.02 % nebulizer solution 0.5 mg  RT - Once        Route: Nebulization  Ordered Dose: 0.5 mg     Last MAR action: Given Makhya Arave DIMITRIOS              Procedures:      Procedures      Interpretations:      O2 sat-           saturation: 100 %; Oxygen use: room air; Interpretation: Normal  EKG -             interpreted by me: Normal sinus rate and rhythm with a ventricular rate of 96. No obvious ST elevations or depressions. No qtc prolongation.     Monitor -         interpreted by me: sinus tachycardia at 100s                 RESULTS        Lab Results:      Results       Procedure Component Value Units Date/Time    Culture Blood Aerobic and Anaerobic [[id number]] Collected: 12/10/20 0717    Specimen: Arm from Blood, Venipuncture Updated: 12/10/20 0943    Narrative:      1 BLUE+1 PURPLE    Culture Blood Aerobic and Anaerobic [[id number]] Collected: 12/10/20 0717    Specimen: Arm from Blood, Venipuncture Updated: 12/10/20 0943    Narrative:      1 BLUE+1 PURPLE    Troponin I [[id number]] Collected: 12/10/20 0655    Specimen: Blood Updated: 12/10/20 0815     Troponin I <0.01 ng/mL     COVID-19 (SARS-CoV-2) and Influenza A/B, NAA (Liat Rapid)- Age 47 and above [[id number]] Collected: 12/10/20 0655    Specimen: Culturette from Nasopharyngeal Updated: 12/10/20 0747     Purpose of COVID testing Diagnostic -PUI     SARS-CoV-2 Specimen Source Nasal Swab     SARS CoV 2 Overall Result Not Detected     Influenza A Not Detected     Influenza B Not Detected    Narrative:      o Collect and clearly label specimen type:  o PREFERRED-Upper respiratory specimen: One Nasal Swab in  Transport Media.  o Hand deliver to laboratory ASAP  Diagnostic -PUI    Lactic Acid [[id number]] Collected: 12/10/20 0717    Specimen: Blood Updated: 12/10/20 0730     Lactic Acid 1.8 mmol/L     Basic Metabolic Panel  [[id number]]  (Abnormal) Collected: 12/10/20 0655    Specimen: Blood Updated: 12/10/20 0730     Glucose 108 mg/dL      BUN 53.6 mg/dL      Creatinine 0.8 mg/dL      Calcium 9.9 mg/dL      Sodium 644 mEq/L      Potassium 4.0 mEq/L      Chloride 103 mEq/L      CO2 28 mEq/L      Anion Gap 10.0    GFR [[id number]] Collected: 12/10/20 0655     Updated: 12/10/20 0730     EGFR >60.0       CBC and differential [[id number]]  (Abnormal) Collected: 12/10/20 0655    Specimen: Blood Updated: 12/10/20 0723     WBC 14.58 x10 3/uL      Hgb 14.6 g/dL      Hematocrit 29.5 %      Platelets 270 x10 3/uL      RBC 4.54 x10 6/uL      MCV 96.9 fL      MCH 32.2 pg      MCHC 33.2 g/dL      RDW 14 %      MPV 10.9 fL      Neutrophils 87.0 %      Lymphocytes Automated 6.9 %      Monocytes 5.3 %      Eosinophils Automated 0.0 %      Basophils Automated 0.3 %  Immature Granulocytes 0.5 %      Nucleated RBC 0.0 /100 WBC      Neutrophils Absolute 12.68 x10 3/uL      Lymphocytes Absolute Automated 1.00 x10 3/uL      Monocytes Absolute Automated 0.78 x10 3/uL      Eosinophils Absolute Automated 0.00 x10 3/uL      Basophils Absolute Automated 0.04 x10 3/uL      Immature Granulocytes Absolute 0.08 x10 3/uL      Absolute NRBC 0.00 x10 3/uL                 Radiology Results:      XR Chest  AP Portable   Final Result         No acute abnormality.      Theador Hawthorne, MD    12/10/2020 6:59 AM                  Scribe Attestation:      I was acting as a Neurosurgeon for [employer], M.D. on Kaylee Burns   Treatment Team: Scribe: Oneida Arenas A.     I am the first provider for this patient and I personally performed the services documented. Treatment Team: Scribe: Oneida Arenas A. is scribing for me on Kaylee Burns,Kaylee Burns. This note accurately reflects work and decisions made by me.  Lus Kriegel, M.D.     Parts of this note were generated by the Epic EMR system/ Dragon speech recognition and may contain inherent errors or omissions not intended  by the user. Grammatical errors, random word insertions, deletions, pronoun errors and incomplete sentences are occasional consequences of this technology due to software limitations. Not all errors are caught or corrected. If there are questions or concerns about the content of this note or information contained within the body of this dictation they should be addressed directly with the author for clarification.     Krishan Mcbreen Dimitrios, MD  12/10/20 2113

## 2020-12-10 NOTE — Discharge Instructions (Addendum)
Dear Ms. Ekblad:    Thank you for choosing the [facility] Emergency Department, the premier emergency department in the [place] area.  I hope your visit today was EXCELLENT. You will receive a survey via text message that will give you the opportunity to provide feedback to your team about your visit. Please do not hesitate to reach out with any questions!    Specific instructions for your visit today:  Should your symptoms worsen, develop shortness of breath, chest pain, numbness, weakness, abdominal pain, nausea, vomiting, fevers or any other concerning sign or symptom, please return to the Emergency Department.  Otherwise, please follow-up with your primary care physician.    Please follow up with your primary care physician.     IF YOU DO NOT CONTINUE TO IMPROVE OR YOUR CONDITION WORSENS, PLEASE CONTACT YOUR DOCTOR OR RETURN IMMEDIATELY TO THE EMERGENCY DEPARTMENT.    Sincerely,  Cache Bills *  Attending Emergency Physician  [facility] Emergency Department      OBTAINING A PRIMARY CARE APPOINTMENT    Primary care physicians (PCPs, also known as primary care doctors) are either internists or family medicine doctors. Both types of PCPs focus on health promotion, disease prevention, patient education and counseling, and treatment of acute and chronic medical conditions.    If you need a primary care doctor, please call the below number and ask who is receiving new patients.     [facility]  Telephone:  [phone]  [email or web address]    DOCTOR REFERRALS  Call [phone] (available 24 hours a day, 7 days a week) if you need any further referrals and we can help you find a primary care doctor or specialist.  Also, available online at:  [email or web address]    YOUR CONTACT INFORMATION  Before leaving please check with registration to make sure we have an up-to-date contact number.  You can call registration at [phone] to update your information.   For questions about your hospital bill, please call [phone].  For questions about your Emergency Dept Physician bill please call [phone].      FREE HEALTH SERVICES  If you need help with health or social services, please call 2-1-1 for a free referral to resources in your area.  2-1-1 is a free service connecting people with information on health insurance, free clinics, pregnancy, mental health, dental care, food assistance, housing, and substance abuse counseling.  Also, available online at:  http://www.[place].org    ORTHOPEDIC INJURY   Please know that significant injuries can exist even when an initial x-ray is read as normal or negative.  This can occur because some fractures (broken bones) are not initially visible on x-rays.  For this reason, close outpatient follow-up with your primary care doctor or bone specialist (orthopedist) is required.    MEDICATIONS AND FOLLOWUP  Please be aware that some prescription medications can cause drowsiness.  Use caution when driving or operating machinery.    The examination and treatment you have received in our Emergency Department is provided on an emergency basis, and is not intended to be a substitute for your primary care physician.  It is important that your doctor checks you again and that you report any new or remaining problems at that time.      ASSISTANCE WITH INSURANCE    Affordable Care Act  [facility])  Call to start or finish an application, compare plans, enroll or ask a question.  [phone]  TTY: [phone]  Web:  Healthcare.gov  Help Enrolling in [place]  [phone] (TOLL-FREE)  [phone] (TTY)  Web:  Http://www.coverva.org    Local Help Enrolling in the [facility]  [place] [facility]  [phone] (MAIN)  Email:  health-help@nvfs .org  Web:  BlackjackMyths.isHttp://www.nvfs.org  Address:  [address], [address] [place], [place] [id number]    SEDATING MEDICATIONS  Sedating medications include strong pain  medications (e.g. narcotics), muscle relaxers, benzodiazepines (used for anxiety and as muscle relaxers), Benadryl/diphenhydramine and other antihistamines for allergic reactions/itching, and other medications.  If you are unsure if you have received a sedating medication, please ask your physician or nurse.  If you received a sedating medication: DO NOT drive a car. DO NOT operate machinery. DO NOT perform jobs where you need to be alert.  DO NOT drink alcoholic beverages while taking this medicine.     If you get dizzy, sit or lie down at the first signs. Be careful going up and down stairs.  Be extra careful to prevent falls.     Never give this medicine to others.     Keep this medicine out of reach of children.     Do not take or save old medicines. Throw them away when outdated.     Keep all medicines in a cool, dry place. DO NOT keep them in your bathroom medicine cabinet or in a cabinet above the stove.    MEDICATION REFILLS  Please be aware that we cannot refill any prescriptions through the ER. If you need further treatment from what is provided at your ER visit, please follow up with your primary care doctor or your pain management specialist.    FREESTANDING EM[facility]GENCY DEPARTMENTS OF [facility]  Did you know [facility] has two freestanding ERs located just a few miles away?  [facility] [place] and [facility] [place] have short wait times, easy free parking directly in front of the building and top patient satisfaction scores - and the same Board Certified Emergency Medicine doctors as [facility].

## 2020-12-21 ENCOUNTER — Other Ambulatory Visit: Payer: Self-pay | Admitting: Internal Medicine

## 2021-01-14 ENCOUNTER — Other Ambulatory Visit: Payer: Self-pay | Admitting: Internal Medicine

## 2021-02-09 ENCOUNTER — Other Ambulatory Visit: Payer: Medicare HMO | Admitting: Internal Medicine

## 2021-02-09 ENCOUNTER — Other Ambulatory Visit: Payer: Self-pay

## 2021-02-09 DIAGNOSIS — E785 Hyperlipidemia, unspecified: Secondary | ICD-10-CM

## 2021-02-09 DIAGNOSIS — E039 Hypothyroidism, unspecified: Secondary | ICD-10-CM | POA: Diagnosis not present

## 2021-02-09 DIAGNOSIS — I10 Essential (primary) hypertension: Secondary | ICD-10-CM | POA: Diagnosis not present

## 2021-02-10 ENCOUNTER — Encounter: Payer: Self-pay | Admitting: Internal Medicine

## 2021-02-10 ENCOUNTER — Ambulatory Visit (INDEPENDENT_AMBULATORY_CARE_PROVIDER_SITE_OTHER): Payer: Medicare HMO | Admitting: Internal Medicine

## 2021-02-10 VITALS — BP 118/64 | HR 97 | Temp 98.2°F | Ht 61.25 in | Wt 167.0 lb

## 2021-02-10 DIAGNOSIS — Z0000 Encounter for general adult medical examination without abnormal findings: Secondary | ICD-10-CM

## 2021-02-10 DIAGNOSIS — I10 Essential (primary) hypertension: Secondary | ICD-10-CM

## 2021-02-10 DIAGNOSIS — E039 Hypothyroidism, unspecified: Secondary | ICD-10-CM

## 2021-02-10 DIAGNOSIS — E785 Hyperlipidemia, unspecified: Secondary | ICD-10-CM | POA: Diagnosis not present

## 2021-02-10 DIAGNOSIS — Z8709 Personal history of other diseases of the respiratory system: Secondary | ICD-10-CM | POA: Diagnosis not present

## 2021-02-10 DIAGNOSIS — F32A Depression, unspecified: Secondary | ICD-10-CM

## 2021-02-10 DIAGNOSIS — J309 Allergic rhinitis, unspecified: Secondary | ICD-10-CM | POA: Diagnosis not present

## 2021-02-10 DIAGNOSIS — F419 Anxiety disorder, unspecified: Secondary | ICD-10-CM

## 2021-02-10 DIAGNOSIS — K219 Gastro-esophageal reflux disease without esophagitis: Secondary | ICD-10-CM

## 2021-02-10 DIAGNOSIS — Z23 Encounter for immunization: Secondary | ICD-10-CM

## 2021-02-10 DIAGNOSIS — R69 Illness, unspecified: Secondary | ICD-10-CM | POA: Diagnosis not present

## 2021-02-10 LAB — CBC WITH DIFFERENTIAL/PLATELET
Absolute Monocytes: 561 cells/uL (ref 200–950)
Basophils Absolute: 79 cells/uL (ref 0–200)
Basophils Relative: 1.2 %
Eosinophils Absolute: 178 cells/uL (ref 15–500)
Eosinophils Relative: 2.7 %
HCT: 41.8 % (ref 35.0–45.0)
Hemoglobin: 14 g/dL (ref 11.7–15.5)
Lymphs Abs: 2884 cells/uL (ref 850–3900)
MCH: 32 pg (ref 27.0–33.0)
MCHC: 33.5 g/dL (ref 32.0–36.0)
MCV: 95.4 fL (ref 80.0–100.0)
MPV: 10.7 fL (ref 7.5–12.5)
Monocytes Relative: 8.5 %
Neutro Abs: 2897 cells/uL (ref 1500–7800)
Neutrophils Relative %: 43.9 %
Platelets: 241 10*3/uL (ref 140–400)
RBC: 4.38 10*6/uL (ref 3.80–5.10)
RDW: 12.7 % (ref 11.0–15.0)
Total Lymphocyte: 43.7 %
WBC: 6.6 10*3/uL (ref 3.8–10.8)

## 2021-02-10 LAB — COMPLETE METABOLIC PANEL WITH GFR
AG Ratio: 1.7 (calc) (ref 1.0–2.5)
ALT: 16 U/L (ref 6–29)
AST: 22 U/L (ref 10–35)
Albumin: 4.3 g/dL (ref 3.6–5.1)
Alkaline phosphatase (APISO): 77 U/L (ref 37–153)
BUN: 23 mg/dL (ref 7–25)
CO2: 27 mmol/L (ref 20–32)
Calcium: 9.9 mg/dL (ref 8.6–10.4)
Chloride: 101 mmol/L (ref 98–110)
Creat: 0.71 mg/dL (ref 0.50–1.05)
Globulin: 2.5 g/dL (calc) (ref 1.9–3.7)
Glucose, Bld: 95 mg/dL (ref 65–99)
Potassium: 4.6 mmol/L (ref 3.5–5.3)
Sodium: 140 mmol/L (ref 135–146)
Total Bilirubin: 0.7 mg/dL (ref 0.2–1.2)
Total Protein: 6.8 g/dL (ref 6.1–8.1)
eGFR: 94 mL/min/{1.73_m2} (ref >=60)

## 2021-02-10 LAB — LIPID PANEL
Cholesterol: 143 mg/dL (ref <200)
HDL: 69 mg/dL (ref >=50)
LDL Cholesterol (Calc): 57 mg/dL (calc)
Non-HDL Cholesterol (Calc): 74 mg/dL (calc) (ref <130)
Total CHOL/HDL Ratio: 2.1 (calc) (ref <5.0)
Triglycerides: 91 mg/dL (ref <150)

## 2021-02-10 LAB — TSH: TSH: 2.03 mIU/L (ref 0.40–4.50)

## 2021-02-10 NOTE — Patient Instructions (Addendum)
It was a pleasure to see you today.  Pneumococcal 20 vaccine given.  Written prescription given for Advair inhaler.  Prescription written for Shingrix vaccine.  Return in 1 year or as needed.  Welcome to Medicare EKG could not be performed due to malfunctioning of machine.

## 2021-02-10 NOTE — Progress Notes (Signed)
Annual Wellness Visit     Patient: Donna Jester, Donna Reynolds, Female    DOB: [birth year], 66 y.o.   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 Had RSV complicated by pneumonia and was seen at [facility].She was treated with Augmentin 875/125 twice daily for 7 days and Zithromax.  White blood cell count was 14, 580.  Influenza and COVID test were negative.  She had shortness of breath cough and wheezing.  She had tachypnea and tachycardia.  She had rhonchi on the right side with auscultation.  She was given a breathing treatment x2.  Although chest x-ray did not show pneumonia there was concern for pneumonia based on clinical findings.  She responded to treatment given and recovered but it took some time to feel better.  Has mammogram scheduled. Planning on colonoscopy. Needs Prevnar 20 today. Shingrix Rx given to obtain vaccination at pharmacy.  Spending time with daughter, son-in-law and 2 grandchildren avoiding a girl in the [place] area.  She is now retired.  Previously worked for [employer].  She has a history of hypertension, hyperlipidemia, allergic rhinitis, asthma, GE reflux and hypothyroidism.  In May 2015 she had an episode of postmenopausal bleeding and ultrasound showed a new left ovarian cyst which appeared to be benign.  History of endometrial hyperplasia without atypia treated March 2013 with D&C followed by Mirena IUD.  Repeat biopsy done September 2014 was negative.  Mirena device removed.  No further episodes of bleeding.  History of allergic rhinitis tested by Dr. Donneta Romberg in the past and was found to have positive skin test to house dust,  dust mites, cockroach, mold mix, slight reaction to grasses.  Not positive to trees, dog, cat or feathers.  Spirometry was normal at that time.  Uses inhalers as needed.  Sometimes has wheezing.  In 2011 had sciatica of the right leg for which tramadol was prescribed.  Occasionally has dependent edema for which Lasix has been prescribed on an as-needed basis.  History of benign breast biopsy in the past.  Had cardiac evaluation February 2012 for chest pain including cardiac cath which was negative.  History of herpes zoster 2006.  Patient had endoscopy and colonoscopy by Dr. Collene Mares in 2009.  Endoscopy was done at the time because of melena.  She had been taking a lot of ibuprofen for dental issues.  Endoscopy showed gastric polyps.  History of left lower lobe pneumonia August 2006.  History of diverticulitis in 2001.  Had positive measles titer in 1999.  Hospitalized for chickenpox in the 22s.  Had mononucleosis in the 1990s.  Laparoscopic cholecystectomy March 1996.  History of migraine headaches.  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  Tried Topamax in the past by Dr. Jannifer Franklin.  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  Social history: She is a retired Immunologist.  She works for [employer] prior to her retirement.  She is a native of [place].  She attended [employer] of medicine.  She is married to a retired Software engineer.  Her parents  immigrated from [place].  She is an only child.  Non-smoker.  Social alcohol consumption.  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  Mother resides in [place].  Family history: Father died of complications of congestive heart failure, had history of chronic kidney disease and history of bladder cancer.  Mother with history of hypertension and hyperlipidemia doing well in her 40s and resides alone near  her daughter.   Social History   Social History Narrative   MarriedPhysician    Social history: She is a retired Consulting civil engineer. Worked for [employer] prior to her retirement. Spends time with new grandchild, all of her, in [place] area. She is a native of [place]. She attended [employer] Medicine. She is married to a retired Software engineer. Parents immigrated from [place]. She is an only child. Non-smoker. Social alcohol consumption. 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 Mother resides in [place].       Family history: Father died of complications of congestive heart failure, had history of chronic kidney disease and history of bladder cancer. Mother with history of hypertension and hyperlipidemia doing well in her 34s residing alone near her daughter.        Patient Care Team: Elby Showers, Donna Reynolds as PCP - General (Internal Medicine) Pleasant, Eppie Gibson, RN as [facility] Management  Review of Systems   Objective    Vitals: BP 118/64    Pulse 97    Temp 98.2 F (36.8 C) (Tympanic)    Ht 5' 1.25" (1.556 m)    Wt 167 lb (75.8 kg)    LMP 06/01/2013 Comment: spotting   SpO2 99%    BMI 31.30 kg/m   Physical Exam  Skin: Warm and dry.  No cervical adenopathy or thyromegaly.  No carotid bruits.  Chest clear.  Cardiac exam regular rate rhythm without ectopy or murmurs.  Abdomen is soft nondistended without hepatosplenomegaly masses or tenderness.  GYN exam deferred to Dr. Edwinna Areola.  No lower extremity pitting edema.  Neuro is intact without gross focal deficits.   Most recent functional status assessment: In your present state of health, do you have any difficulty performing the following activities: 02/10/2021  Hearing? N  Vision? N  Difficulty concentrating or making decisions? N  Walking or climbing stairs? N  Dressing or bathing? N  Doing errands, shopping? N  Preparing Food and eating ? N   Using the Toilet? N  In the past six months, have you accidently leaked urine? N  Do you have problems with loss of bowel control? N  Managing your Medications? N  Managing your Finances? N  Housekeeping or managing your Housekeeping? N  Some recent data might be hidden   Most recent fall risk assessment: Fall Risk  02/10/2021  Falls in the past year? 1  Number falls in past yr: 0  Injury with Fall? 0  Risk for fall due to : No Fall Risks  Follow up Falls evaluation completed    Most recent depression screenings: PHQ 2/9 Scores 02/10/2021 07/04/2020  PHQ - 2 Score 0 0  PHQ- 9 Score - -   Most recent cognitive screening: 6CIT Screen 02/10/2021  What Year? 0 points  What month? 0 points  What time? 0 points  Count back from 20 0 points  Months in reverse 0 points  Repeat phrase 0 points  Total Score 0  Assessment & Plan     Annual wellness visit done today including the all of the following: Reviewed patient's Family Medical History Reviewed and updated list of patient's medical providers Assessment of cognitive impairment was done Assessed patient's functional ability Established a written schedule for health screening [place] Completed and Reviewed  Discussed health benefits of physical activity, and encouraged her to engage in regular exercise appropriate for her age and condition.         {I, Elby Showers, Donna Reynolds, have reviewed all documentation for this visit. The documentation on 02/10/21 for the exam, diagnosis, procedures, and orders are all accurate and complete.   Angus Seller, CMA

## 2021-04-01 ENCOUNTER — Other Ambulatory Visit: Payer: Self-pay | Admitting: Internal Medicine

## 2021-04-14 DIAGNOSIS — Z1231 Encounter for screening mammogram for malignant neoplasm of breast: Secondary | ICD-10-CM | POA: Diagnosis not present

## 2021-04-15 ENCOUNTER — Encounter (HOSPITAL_BASED_OUTPATIENT_CLINIC_OR_DEPARTMENT_OTHER): Payer: Self-pay | Admitting: *Deleted

## 2021-04-17 ENCOUNTER — Other Ambulatory Visit: Payer: Self-pay

## 2021-04-17 MED ORDER — TRIAMCINOLONE ACETONIDE 0.1 % EX CREA: 1.0000 "application " | TOPICAL_CREAM | Freq: Three times a day (TID) | CUTANEOUS | 3 refills | Status: DC

## 2021-06-05 ENCOUNTER — Encounter: Payer: Self-pay | Admitting: Internal Medicine

## 2021-06-05 ENCOUNTER — Telehealth: Payer: Self-pay | Admitting: Internal Medicine

## 2021-06-05 DIAGNOSIS — M7918 Myalgia, other site: Secondary | ICD-10-CM

## 2021-06-05 MED ORDER — HYDROCODONE-ACETAMINOPHEN 5-325 MG PO TABS: 1.0000 | ORAL_TABLET | ORAL | 0 refills | Status: DC | PRN

## 2021-06-05 NOTE — Telephone Encounter (Addendum)
Leaving on trip this coming Sunday and needs pain med as there will be lots of walking. Send in [place] # 30 with no refill to [facility]. ?MJB,MD ?

## 2021-06-26 ENCOUNTER — Other Ambulatory Visit: Payer: Self-pay | Admitting: Internal Medicine

## 2021-06-30 DIAGNOSIS — K219 Gastro-esophageal reflux disease without esophagitis: Secondary | ICD-10-CM | POA: Diagnosis not present

## 2021-06-30 DIAGNOSIS — E669 Obesity, unspecified: Secondary | ICD-10-CM | POA: Diagnosis not present

## 2021-06-30 DIAGNOSIS — E782 Mixed hyperlipidemia: Secondary | ICD-10-CM | POA: Diagnosis not present

## 2021-06-30 DIAGNOSIS — Z1211 Encounter for screening for malignant neoplasm of colon: Secondary | ICD-10-CM | POA: Diagnosis not present

## 2021-06-30 DIAGNOSIS — K5900 Constipation, unspecified: Secondary | ICD-10-CM | POA: Diagnosis not present

## 2021-06-30 DIAGNOSIS — I10 Essential (primary) hypertension: Secondary | ICD-10-CM | POA: Diagnosis not present

## 2021-07-01 ENCOUNTER — Telehealth: Payer: Self-pay | Admitting: Internal Medicine

## 2021-07-01 NOTE — Telephone Encounter (Signed)
scheduled

## 2021-07-01 NOTE — Telephone Encounter (Signed)
Donna Reynolds [phone]  [name] called to say for the last week her blood pressure has been elevated and she is having a headache. She takes her medicine as soon as she gets up in the morning and her blood pressure 1-2 hours later. She would like to come in and see you.  06/24/2021 Wed  145/74 06/25/2021 Thur 156/80 06/26/2021 Fri  160/72 06/27/2021 sat  155/74 06/28/2021 Sun  158/81 06/29/2021 Mon 132/80 06/30/2021 Tues 145/84 07/01/2021 Wed 150/80

## 2021-07-02 ENCOUNTER — Ambulatory Visit (INDEPENDENT_AMBULATORY_CARE_PROVIDER_SITE_OTHER): Payer: Medicare HMO | Admitting: Internal Medicine

## 2021-07-02 ENCOUNTER — Encounter: Payer: Self-pay | Admitting: Internal Medicine

## 2021-07-02 VITALS — BP 130/82 | HR 82 | Temp 98.2°F | Ht 61.25 in | Wt 166.5 lb

## 2021-07-02 DIAGNOSIS — Z7184 Encounter for health counseling related to travel: Secondary | ICD-10-CM

## 2021-07-02 DIAGNOSIS — E039 Hypothyroidism, unspecified: Secondary | ICD-10-CM | POA: Diagnosis not present

## 2021-07-02 DIAGNOSIS — I10 Essential (primary) hypertension: Secondary | ICD-10-CM

## 2021-07-02 DIAGNOSIS — E7800 Pure hypercholesterolemia, unspecified: Secondary | ICD-10-CM | POA: Diagnosis not present

## 2021-07-02 MED ORDER — ONDANSETRON HCL 4 MG PO TABS: 4.0000 mg | ORAL_TABLET | Freq: Three times a day (TID) | ORAL | 0 refills | Status: DC | PRN

## 2021-07-02 NOTE — Progress Notes (Signed)
   Subjective:    Patient ID: Donna Jester, MD, female    DOB: [birth year], 66 y.o.   MRN: [id number]  HPI 66 year old Female recently had elevated blood pressure readings and was concerned.  She increased losartan to 2 x '25mg'$  tablets daily.  Labs drawn include free T4 and TSH which are normal.  BUN and creatinine are normal.  Electrolytes are normal.  Liver functions are normal and estimated GFR is 98 cc/min.  Patient sent in numerous blood pressure readings in late May starting on May 24 at 145/74.  Lowest blood pressure reading was 132/80 on May 29.  Basically and had elevated systolic readings in the 917H to mid 150s with normal diastolic pressures.  She was also having some headache.  She had no respiratory illness at the time and was not sure why she was having a headache other than elevated blood pressure readings.  Currently on levothyroxine 50 mcg daily.  She has medications for travel to [place] including antibiotics.  In early May refilled hydrocodone APAP for musculoskeletal pain.  She prefers to continue with 25 mg of losartan and take 2 tablets instead of 1.  Also Zofran has been refilled for travel if she has nausea.  Is on pravastatin 40 mg daily for hyperlipidemia and lipids were normal in January.    Review of Systems other than elevated blood pressure readings and headache no new complaints     Objective:   Physical Exam Today blood pressure is stable at 130/82 pulse 82 temperature 98.2 degrees pulse oximetry 98% weight 166 pounds 8 ounces BMI 31.20.  Weight is stable from January 2023. Skin: Warm and dry.  No cervical adenopathy, thyromegaly, or carotid bruits.  Chest is clear to auscultation.  Cardiac exam: Regular rate and rhythm without ectopy or murmur.  No lower extremity pitting edema.      Assessment & Plan:  Recent elevated blood pressure readings and patient has increase losartan to 50 mg daily.  Currently taking 2 x 25 mg tablets daily.  She will  continue with 50 mg daily of losartan.  TSH is normal-continue same dose of thyroid replacement  Electrolytes and glucose are normal.  Kidney functions are normal.  Hyperlipidemia-currently treated with pravastatin 40 mg daily and lipids are normal.  HDL is excellent at 69.  She does try to get regular exercise.  Plan: Patient has medications for travel to [place] in the near future.  She will continue to monitor blood pressure.  I am reassured that these lab studies are within normal limits and acceptable.  She has hydrocodone APAP to take if she needs it for musculoskeletal pain.  Follow-up here in January 2024 for annual exam and Medicare wellness visit.  Zofran tablets prescribed for nausea if needed with travel.  CT coronary calcium scoring ordered and patient can schedule at her convenience.

## 2021-07-03 LAB — COMPLETE METABOLIC PANEL WITH GFR
AG Ratio: 1.8 (calc) (ref 1.0–2.5)
ALT: 19 U/L (ref 6–29)
AST: 26 U/L (ref 10–35)
Albumin: 4.4 g/dL (ref 3.6–5.1)
Alkaline phosphatase (APISO): 77 U/L (ref 37–153)
BUN: 18 mg/dL (ref 7–25)
CO2: 24 mmol/L (ref 20–32)
Calcium: 10.1 mg/dL (ref 8.6–10.4)
Chloride: 104 mmol/L (ref 98–110)
Creat: 0.63 mg/dL (ref 0.50–1.05)
Globulin: 2.5 g/dL (calc) (ref 1.9–3.7)
Glucose, Bld: 101 mg/dL — ABNORMAL HIGH (ref 65–99)
Potassium: 4.4 mmol/L (ref 3.5–5.3)
Sodium: 142 mmol/L (ref 135–146)
Total Bilirubin: 0.8 mg/dL (ref 0.2–1.2)
Total Protein: 6.9 g/dL (ref 6.1–8.1)
eGFR: 98 mL/min/{1.73_m2} (ref >=60)

## 2021-07-03 LAB — CBC WITH DIFFERENTIAL/PLATELET
Absolute Monocytes: 608 cells/uL (ref 200–950)
Basophils Absolute: 70 cells/uL (ref 0–200)
Basophils Relative: 1.1 %
Eosinophils Absolute: 102 cells/uL (ref 15–500)
Eosinophils Relative: 1.6 %
HCT: 42.7 % (ref 35.0–45.0)
Hemoglobin: 14.3 g/dL (ref 11.7–15.5)
Lymphs Abs: 1862 cells/uL (ref 850–3900)
MCH: 32.2 pg (ref 27.0–33.0)
MCHC: 33.5 g/dL (ref 32.0–36.0)
MCV: 96.2 fL (ref 80.0–100.0)
MPV: 10.9 fL (ref 7.5–12.5)
Monocytes Relative: 9.5 %
Neutro Abs: 3757 cells/uL (ref 1500–7800)
Neutrophils Relative %: 58.7 %
Platelets: 234 10*3/uL (ref 140–400)
RBC: 4.44 10*6/uL (ref 3.80–5.10)
RDW: 13 % (ref 11.0–15.0)
Total Lymphocyte: 29.1 %
WBC: 6.4 10*3/uL (ref 3.8–10.8)

## 2021-07-03 LAB — TSH: TSH: 1.28 mIU/L (ref 0.40–4.50)

## 2021-07-03 LAB — T4, FREE: Free T4: 1.2 ng/dL (ref 0.8–1.8)

## 2021-07-06 ENCOUNTER — Ambulatory Visit (HOSPITAL_BASED_OUTPATIENT_CLINIC_OR_DEPARTMENT_OTHER): Payer: BC Managed Care – PPO | Admitting: Obstetrics & Gynecology

## 2021-07-17 ENCOUNTER — Other Ambulatory Visit (HOSPITAL_COMMUNITY)
Admission: RE | Admit: 2021-07-17 | Discharge: 2021-07-17 | Disposition: A | Discharge status: Home / Self Care | Payer: Medicare HMO | Source: Ambulatory Visit | Attending: Obstetrics & Gynecology | Admitting: Obstetrics & Gynecology

## 2021-07-17 ENCOUNTER — Encounter (HOSPITAL_BASED_OUTPATIENT_CLINIC_OR_DEPARTMENT_OTHER): Payer: Self-pay | Admitting: Obstetrics & Gynecology

## 2021-07-17 ENCOUNTER — Ambulatory Visit (INDEPENDENT_AMBULATORY_CARE_PROVIDER_SITE_OTHER): Payer: Medicare HMO | Admitting: Obstetrics & Gynecology

## 2021-07-17 VITALS — BP 139/85 | HR 87 | Ht 62.0 in | Wt 167.8 lb

## 2021-07-17 DIAGNOSIS — N8501 Benign endometrial hyperplasia: Secondary | ICD-10-CM

## 2021-07-17 DIAGNOSIS — Z780 Asymptomatic menopausal state: Secondary | ICD-10-CM

## 2021-07-17 DIAGNOSIS — E669 Obesity, unspecified: Secondary | ICD-10-CM | POA: Insufficient documentation

## 2021-07-17 DIAGNOSIS — Z87448 Personal history of other diseases of urinary system: Secondary | ICD-10-CM

## 2021-07-17 DIAGNOSIS — Z01411 Encounter for gynecological examination (general) (routine) with abnormal findings: Secondary | ICD-10-CM

## 2021-07-17 DIAGNOSIS — Z124 Encounter for screening for malignant neoplasm of cervix: Secondary | ICD-10-CM | POA: Diagnosis not present

## 2021-07-17 DIAGNOSIS — N812 Incomplete uterovaginal prolapse: Secondary | ICD-10-CM

## 2021-07-17 DIAGNOSIS — K5900 Constipation, unspecified: Secondary | ICD-10-CM | POA: Insufficient documentation

## 2021-07-17 NOTE — Progress Notes (Unsigned)
66 y.o. Donna Reynolds Married White or Caucasian female here for breast and pelvic exam.  I am also following her for h/o endometrial hyperplasia.    Going to [place] next week.  Daughter is great.  She has a son and daughter.  Denies vaginal bleeding.  H/o simple endometrial hyperplasia 2/13.  Had IUD placed 3/13.  Follow up biopsy 9/13.    Patient's last menstrual period was 06/01/2013.          Sexually active: No.  H/O STD:  no  Health Maintenance: PCP:  Dr. Renold Genta.  Last wellness appt was 02/09/2021.  Did blood work at that appt:   Vaccines are up to date:  yes Colonoscopy:  2009, scheduled 08/19/2021 with Dr. Collene Mares MMG:  04/14/2021 Negative BMD:  03/07/2019 Last pap smear:  03/01/2019 Negative.   H/o abnormal pap smear:  no hx    reports that she has never smoked. She has never used smokeless tobacco. She reports that she does not drink alcohol and does not use drugs.  Past Medical History:  Diagnosis Date   Anxiety    Asthma    Dyslipidemia    GERD (gastroesophageal reflux disease)    HTN (hypertension)    Hypothyroidism     Past Surgical History:  Procedure Laterality Date   BREAST SURGERY Left 2000   [place]   CARDIAC CATHETERIZATION     CHOLECYSTECTOMY     DIAGNOSTIC LAPAROSCOPY     DILATION AND CURETTAGE OF UTERUS  04/12/2011   Procedure: DILATATION AND CURETTAGE;  Surgeon: Megan Salon, MD;  Location: [facility];  Service: Gynecology;  Laterality: N/A;   INTRAUTERINE DEVICE INSERTION  04/19/11   Mirena    Current Outpatient Medications  Medication Sig Dispense Refill   acetaminophen (TYLENOL) 500 MG tablet Take 1,000 mg by mouth as needed. For pain      ALPRAZolam (XANAX) 0.5 MG tablet Take 1 tablet (0.5 mg total) by mouth 3 (three) times daily as needed for anxiety. 30 tablet 0   cholecalciferol (VITAMIN D) 1000 UNITS tablet Take 1,000 Units by mouth daily.     citalopram (CELEXA) 10 MG tablet TAKE 1 TABLET BY MOUTH EVERY DAY 90 tablet 3   docusate sodium  (COLACE) 100 MG capsule Take 100 mg by mouth 2 (two) times daily.     esomeprazole (NEXIUM) 20 MG capsule Take 20 mg by mouth daily at 12 noon.     fexofenadine (ALLEGRA) 180 MG tablet Take 180 mg by mouth as needed for allergies or rhinitis.     fluticasone-salmeterol (ADVAIR) 250-50 MCG/ACT AEPB Advair Diskus     HYDROcodone-acetaminophen (NORCO) 5-325 MG tablet Take 1 tablet by mouth every 4 (four) hours as needed for moderate pain. 30 tablet 0   levothyroxine (SYNTHROID) 50 MCG tablet TAKE 1 TABLET BY MOUTH EVERY DAY 90 tablet 1   losartan (COZAAR) 25 MG tablet TAKE 2 TABLETS BY MOUTH EVERY DAY 180 tablet 3   Omega-3 1000 MG CAPS      ondansetron (ZOFRAN) 4 MG tablet Take 1 tablet (4 mg total) by mouth every 8 (eight) hours as needed for nausea or vomiting. 20 tablet 0   pravastatin (PRAVACHOL) 40 MG tablet TAKE 1 TABLET BY MOUTH EVERYDAY AT BEDTIME 90 tablet 3   triamcinolone cream (KENALOG) 0.1 % Apply 1 application. topically 3 (three) times daily. 80 g 3   No current facility-administered medications for this visit.    Family History  Problem Relation Age of Onset  Coronary artery disease Father    COPD Father    Cancer Father        Bladder   Hyperlipidemia Mother    Hypertension Mother     Review of Systems  Constitutional: Negative.   Gastrointestinal: Negative.   Genitourinary: Negative.     Exam:   BP 139/85 (BP Location: Right Arm, Patient Position: Sitting, Cuff Size: Large)   Pulse 87   Ht '5\' 2"'$  (1.575 m)   Wt 167 lb 12.8 oz (76.1 kg)   LMP 06/01/2013 Comment: spotting  BMI 30.69 kg/m   Height: '5\' 2"'$  (157.5 cm)  General appearance: alert, cooperative and appears stated age Breasts: normal appearance, no masses or tenderness Abdomen: soft, non-tender; bowel sounds normal; no masses,  no organomegaly Lymph nodes: Cervical, supraclavicular, and axillary nodes normal.  No abnormal inguinal nodes palpated Neurologic: Grossly normal  Pelvic: External  genitalia:  no lesions              Urethra:  normal appearing urethra with no masses, tenderness or lesions              Bartholins and Skenes: normal                 Vagina: normal appearing vagina with atrophic changes and no discharge, no lesions              Cervix: no lesions              Pap taken: {yes no:314532} Bimanual Exam:  Uterus:  {exam; uterus:12215}              Adnexa: {exam; adnexa:12223}               Rectovaginal: Confirms               Anus:  normal sphincter tone, no lesions  Chaperone, ***, CMA, was present for exam.  Assessment/Plan: There are no diagnoses linked to this encounter.

## 2021-07-18 NOTE — Patient Instructions (Signed)
Labs are stable.  Hydrocodone APAP prescribed for musculoskeletal pain with travel.  Continue with losartan 50 mg daily.  Blood pressure is stable on this dose.  Continue same dose of thyroid replacement.  Continue pravastatin 40 mg daily as lipids are normal.  Return here in January 2024 for annual exam and Medicare wellness visit.

## 2021-07-20 LAB — CYTOLOGY - PAP: Diagnosis: NEGATIVE

## 2021-08-05 ENCOUNTER — Other Ambulatory Visit: Payer: Self-pay | Admitting: Internal Medicine

## 2021-08-13 ENCOUNTER — Other Ambulatory Visit: Payer: Medicare HMO

## 2021-08-13 DIAGNOSIS — E7800 Pure hypercholesterolemia, unspecified: Secondary | ICD-10-CM | POA: Diagnosis not present

## 2021-08-14 ENCOUNTER — Encounter: Payer: Self-pay | Admitting: Internal Medicine

## 2021-08-14 ENCOUNTER — Ambulatory Visit (INDEPENDENT_AMBULATORY_CARE_PROVIDER_SITE_OTHER): Payer: Medicare HMO | Admitting: Internal Medicine

## 2021-08-14 VITALS — BP 120/72 | HR 71 | Temp 97.6°F | Wt 166.5 lb

## 2021-08-14 DIAGNOSIS — K219 Gastro-esophageal reflux disease without esophagitis: Secondary | ICD-10-CM

## 2021-08-14 DIAGNOSIS — E7800 Pure hypercholesterolemia, unspecified: Secondary | ICD-10-CM

## 2021-08-14 DIAGNOSIS — I10 Essential (primary) hypertension: Secondary | ICD-10-CM | POA: Diagnosis not present

## 2021-08-14 DIAGNOSIS — E039 Hypothyroidism, unspecified: Secondary | ICD-10-CM

## 2021-08-14 LAB — LIPID PANEL
Cholesterol: 136 mg/dL (ref <200)
HDL: 64 mg/dL (ref >=50)
LDL Cholesterol (Calc): 58 mg/dL (calc)
Non-HDL Cholesterol (Calc): 72 mg/dL (calc) (ref <130)
Total CHOL/HDL Ratio: 2.1 (calc) (ref <5.0)
Triglycerides: 68 mg/dL (ref <150)

## 2021-08-14 NOTE — Patient Instructions (Addendum)
It was a pleasure to see you today.  Labs are stable.  Follow-up with health maintenance exam and Medicare wellness visit in 6 months.  Have COVID booster and flu vaccine in the Fall.  No change in medications.

## 2021-08-14 NOTE — Progress Notes (Signed)
   Subjective:    Patient ID: Donna Jester, MD, female    DOB: [birth year], 66 y.o.   MRN: [id number]  HPI 66 year old Female seen for 46-month follow up.  Feels well has no new complaints.  History of hypertension, hyperlipidemia, allergic rhinitis, asthma, GE reflux and hypothyroidism.  Vaccines reviewed.  Recently had second Shingrix vaccine in June.  Had pneumococcal 20 vaccine in January 2023.  Tetanus immunization is up-to-date.  She saw GYN physician, Dr. Sabra Heck recently.  She will see her again in June 2025.  Her Pap smear was normal.  Order has been placed for coronary calcium scoring but she has not heard from [facility].  Given phone number to call [facility].  Has hydrocodone APAP which she takes sparingly for musculoskeletal pain which may occur with prolonged hiking.  Recent fasting labs are within normal limits with the exception of fasting glucose 101.  TSH is normal, free T4 normal, lipid panel normal, CBC and c-Met stable.  Annual physical exam and Medicare wellness visit scheduled January 2024.  Mammogram up-to-date.  Colonoscopy up-to-date.      Review of Systems see above- no other issues today     Objective:   Physical Exam   BP 120/72 pulse 71 T 97.6 degrees Pulse ox 98% Weight 166 lb 8 oz weight 30.45  Skin: Warm and dry.  Neck is supple.  Chest clear.  Cardiac exam regular rate and rhythm.  No thyromegaly.  No lower extremity pitting edema.    Assessment & Plan:   Hypothyroidism-TSH is excellent at 1.28 on levothyroxine 50 mcg daily  Hypertension stable on losartan 50 mg daily.  Blood pressure excellent today at 120/72.  Hyperlipidemia treated with Pravachol 40 mg daily and lipid panel is completely normal.  LDL is 58 and triglycerides are 68.  Total cholesterol is 136 and HDL was 64.  GE reflux treated with Nexium and stable  Anxiety/ depression stable with Celexa and Xanax.  Plan: She had Pneumococcal 20 vaccine in  January 2023.  Recommend COVID booster in the Fall.  Recommend flu vaccine in the Fall.  Follow-up in 6 months.  No change in medications.

## 2021-08-17 DIAGNOSIS — H40033 Anatomical narrow angle, bilateral: Secondary | ICD-10-CM | POA: Diagnosis not present

## 2021-08-17 DIAGNOSIS — H5203 Hypermetropia, bilateral: Secondary | ICD-10-CM | POA: Diagnosis not present

## 2021-08-17 DIAGNOSIS — H2513 Age-related nuclear cataract, bilateral: Secondary | ICD-10-CM | POA: Diagnosis not present

## 2021-08-19 DIAGNOSIS — K5730 Diverticulosis of large intestine without perforation or abscess without bleeding: Secondary | ICD-10-CM | POA: Diagnosis not present

## 2021-08-19 DIAGNOSIS — Z1211 Encounter for screening for malignant neoplasm of colon: Secondary | ICD-10-CM | POA: Diagnosis not present

## 2021-08-19 DIAGNOSIS — D125 Benign neoplasm of sigmoid colon: Secondary | ICD-10-CM | POA: Diagnosis not present

## 2021-08-19 DIAGNOSIS — K635 Polyp of colon: Secondary | ICD-10-CM | POA: Diagnosis not present

## 2021-09-01 ENCOUNTER — Ambulatory Visit (HOSPITAL_COMMUNITY)
Admission: RE | Admit: 2021-09-01 | Discharge: 2021-09-01 | Disposition: A | Discharge status: Home / Self Care | Payer: Medicare HMO | Source: Ambulatory Visit | Attending: Internal Medicine | Admitting: Internal Medicine

## 2021-09-01 DIAGNOSIS — E7800 Pure hypercholesterolemia, unspecified: Secondary | ICD-10-CM | POA: Insufficient documentation

## 2021-09-11 DIAGNOSIS — Z0100 Encounter for examination of eyes and vision without abnormal findings: Secondary | ICD-10-CM | POA: Diagnosis not present

## 2021-09-21 DIAGNOSIS — L570 Actinic keratosis: Secondary | ICD-10-CM | POA: Diagnosis not present

## 2021-09-24 ENCOUNTER — Other Ambulatory Visit: Payer: Self-pay | Admitting: Internal Medicine

## 2021-10-03 ENCOUNTER — Other Ambulatory Visit: Payer: Self-pay | Admitting: Internal Medicine

## 2021-10-13 DIAGNOSIS — D239 Other benign neoplasm of skin, unspecified: Secondary | ICD-10-CM | POA: Diagnosis not present

## 2021-10-13 DIAGNOSIS — L821 Other seborrheic keratosis: Secondary | ICD-10-CM | POA: Diagnosis not present

## 2021-11-20 ENCOUNTER — Telehealth: Payer: Self-pay

## 2021-11-20 ENCOUNTER — Telehealth: Payer: Self-pay | Admitting: Internal Medicine

## 2021-11-20 MED ORDER — NIRMATRELVIR/RITONAVIR (PAXLOVID)TABLET: 3.0000 | ORAL_TABLET | Freq: Two times a day (BID) | ORAL | 0 refills | Status: AC

## 2021-11-20 NOTE — Telephone Encounter (Signed)
Patient has tested positive for Covid-19. Husband has it as well. Sending in Paxlovid regular strength to pharmacy. MJB, MD

## 2021-11-20 NOTE — Telephone Encounter (Signed)
Patient informed and will pick up Rx from the pharmacy

## 2021-11-20 NOTE — Telephone Encounter (Signed)
Patient called stating she had spoken to you the other day and to call the office if Covid test is positive and is and would like Rx for Paxlovid sent to   [place], [place] Phone: [phone]  Fax: [phone]

## 2021-12-21 ENCOUNTER — Telehealth: Payer: Self-pay | Admitting: Internal Medicine

## 2021-12-21 ENCOUNTER — Encounter: Payer: Self-pay | Admitting: Internal Medicine

## 2021-12-21 MED ORDER — AMOXICILLIN-POT CLAVULANATE 500-125 MG PO TABS: 1.0000 | ORAL_TABLET | Freq: Three times a day (TID) | ORAL | 0 refills | Status: DC

## 2021-12-21 MED ORDER — FLUCONAZOLE 150 MG PO TABS: 150.0000 mg | ORAL_TABLET | Freq: Once | ORAL | 0 refills | Status: AC

## 2021-12-21 NOTE — Telephone Encounter (Signed)
Patient awakened with redness and irritation of her face. Cat was licking her face. Seems to have mild cellultitis. She and I agree she shoul take Augmentin. She is leaving town for Thanksgiving. Sending in [id number] 500 mg 3 times a day #21 and Diflucan 150 mg tab x1. MJB, MD

## 2022-01-05 ENCOUNTER — Other Ambulatory Visit: Payer: Self-pay | Admitting: Internal Medicine

## 2022-02-04 ENCOUNTER — Telehealth: Payer: Self-pay | Admitting: Internal Medicine

## 2022-02-04 ENCOUNTER — Encounter: Payer: Self-pay | Admitting: Internal Medicine

## 2022-02-04 NOTE — Telephone Encounter (Signed)
Noted  

## 2022-02-04 NOTE — Telephone Encounter (Signed)
Patient was able to get inhaler from another pharamcy. Spoke with her just now. Does not need anything refilled at this time

## 2022-02-08 ENCOUNTER — Other Ambulatory Visit: Payer: Medicare HMO

## 2022-02-08 DIAGNOSIS — I10 Essential (primary) hypertension: Secondary | ICD-10-CM | POA: Diagnosis not present

## 2022-02-08 DIAGNOSIS — E7800 Pure hypercholesterolemia, unspecified: Secondary | ICD-10-CM | POA: Diagnosis not present

## 2022-02-08 DIAGNOSIS — E039 Hypothyroidism, unspecified: Secondary | ICD-10-CM | POA: Diagnosis not present

## 2022-02-09 LAB — CBC WITH DIFFERENTIAL/PLATELET
Absolute Monocytes: 647 cells/uL (ref 200–950)
Basophils Absolute: 67 cells/uL (ref 0–200)
Basophils Relative: 1.1 %
Eosinophils Absolute: 122 cells/uL (ref 15–500)
Eosinophils Relative: 2 %
HCT: 40.6 % (ref 35.0–45.0)
Hemoglobin: 13.9 g/dL (ref 11.7–15.5)
Lymphs Abs: 2318 cells/uL (ref 850–3900)
MCH: 32.4 pg (ref 27.0–33.0)
MCHC: 34.2 g/dL (ref 32.0–36.0)
MCV: 94.6 fL (ref 80.0–100.0)
MPV: 10.7 fL (ref 7.5–12.5)
Monocytes Relative: 10.6 %
Neutro Abs: 2946 cells/uL (ref 1500–7800)
Neutrophils Relative %: 48.3 %
Platelets: 265 10*3/uL (ref 140–400)
RBC: 4.29 10*6/uL (ref 3.80–5.10)
RDW: 12.8 % (ref 11.0–15.0)
Total Lymphocyte: 38 %
WBC: 6.1 10*3/uL (ref 3.8–10.8)

## 2022-02-09 LAB — COMPLETE METABOLIC PANEL WITH GFR
AG Ratio: 1.7 (calc) (ref 1.0–2.5)
ALT: 12 U/L (ref 6–29)
AST: 18 U/L (ref 10–35)
Albumin: 4.4 g/dL (ref 3.6–5.1)
Alkaline phosphatase (APISO): 66 U/L (ref 37–153)
BUN: 18 mg/dL (ref 7–25)
CO2: 29 mmol/L (ref 20–32)
Calcium: 9.6 mg/dL (ref 8.6–10.4)
Chloride: 105 mmol/L (ref 98–110)
Creat: 0.72 mg/dL (ref 0.50–1.05)
Globulin: 2.6 g/dL (calc) (ref 1.9–3.7)
Glucose, Bld: 94 mg/dL (ref 65–99)
Potassium: 4.2 mmol/L (ref 3.5–5.3)
Sodium: 140 mmol/L (ref 135–146)
Total Bilirubin: 0.6 mg/dL (ref 0.2–1.2)
Total Protein: 7 g/dL (ref 6.1–8.1)
eGFR: 92 mL/min/{1.73_m2} (ref >=60)

## 2022-02-09 LAB — LIPID PANEL
Cholesterol: 155 mg/dL (ref <200)
HDL: 72 mg/dL (ref >=50)
LDL Cholesterol (Calc): 69 mg/dL (calc)
Non-HDL Cholesterol (Calc): 83 mg/dL (calc) (ref <130)
Total CHOL/HDL Ratio: 2.2 (calc) (ref <5.0)
Triglycerides: 68 mg/dL (ref <150)

## 2022-02-09 LAB — TSH: TSH: 2.83 mIU/L (ref 0.40–4.50)

## 2022-02-11 ENCOUNTER — Encounter: Payer: Self-pay | Admitting: Internal Medicine

## 2022-02-11 ENCOUNTER — Ambulatory Visit (INDEPENDENT_AMBULATORY_CARE_PROVIDER_SITE_OTHER): Payer: Medicare HMO | Admitting: Internal Medicine

## 2022-02-11 VITALS — BP 122/82 | HR 82 | Temp 98.5°F | Ht 61.5 in | Wt 169.8 lb

## 2022-02-11 DIAGNOSIS — J309 Allergic rhinitis, unspecified: Secondary | ICD-10-CM | POA: Diagnosis not present

## 2022-02-11 DIAGNOSIS — K219 Gastro-esophageal reflux disease without esophagitis: Secondary | ICD-10-CM

## 2022-02-11 DIAGNOSIS — E7800 Pure hypercholesterolemia, unspecified: Secondary | ICD-10-CM

## 2022-02-11 DIAGNOSIS — E039 Hypothyroidism, unspecified: Secondary | ICD-10-CM | POA: Diagnosis not present

## 2022-02-11 DIAGNOSIS — Z0000 Encounter for general adult medical examination without abnormal findings: Secondary | ICD-10-CM | POA: Diagnosis not present

## 2022-02-11 DIAGNOSIS — I10 Essential (primary) hypertension: Secondary | ICD-10-CM

## 2022-02-11 DIAGNOSIS — Z8659 Personal history of other mental and behavioral disorders: Secondary | ICD-10-CM

## 2022-02-11 DIAGNOSIS — Z8709 Personal history of other diseases of the respiratory system: Secondary | ICD-10-CM | POA: Diagnosis not present

## 2022-02-11 DIAGNOSIS — R918 Other nonspecific abnormal finding of lung field: Secondary | ICD-10-CM

## 2022-02-11 DIAGNOSIS — R69 Illness, unspecified: Secondary | ICD-10-CM | POA: Diagnosis not present

## 2022-02-11 LAB — POCT URINALYSIS DIPSTICK
Bilirubin, UA: NEGATIVE
Blood, UA: NEGATIVE
Glucose, UA: NEGATIVE
Ketones, UA: NEGATIVE
Leukocytes, UA: NEGATIVE
Nitrite, UA: NEGATIVE
Protein, UA: NEGATIVE
Spec Grav, UA: 1.015 (ref 1.010–1.025)
Urobilinogen, UA: 0.2 E.U./dL
pH, UA: 6 (ref 5.0–8.0)

## 2022-02-11 MED ORDER — NIRMATRELVIR/RITONAVIR (PAXLOVID)TABLET: 3.0000 | ORAL_TABLET | Freq: Two times a day (BID) | ORAL | 0 refills | Status: DC

## 2022-02-11 MED ORDER — NIRMATRELVIR/RITONAVIR (PAXLOVID)TABLET: 3.0000 | ORAL_TABLET | Freq: Two times a day (BID) | ORAL | 0 refills | Status: AC

## 2022-02-11 NOTE — Progress Notes (Signed)
Annual Wellness Visit     Patient: Donna Jester, MD, Female    DOB: Oct 12, 1955, 67 y.o.   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  HPIShe is also here for health maintenance exam and evaluation of medical issues.She feels well. Now taking one half  tab of Paxil  daily and doing well.  She has a history of of hypertension, hyperlipidemia, allergic rhinitis GE reflux and hypothyroidism.  History of RSV complicated by pneumonia in November 2022.  History of allergic rhinitis seen by Dr. Donneta Romberg in the past and was found to have positive skin test to house dust, dust mites, cockroach, mold mix, slight reaction to grasses.  Spirometry was normal.  Was advised to use inhalers as needed.  Sometimes has wheezing.  In 2011 had sciatica of the right leg for which tramadol was prescribed.  Occasionally has dependent edema for which Lasix has been prescribed on an as-needed basis  Had cardiac evaluation February 2012 for chest pain including cardiac cath which was negative.  History of herpes zoster 2006.  Patient had endoscopy and colonoscopy by Dr. Collene Mares in 2009.  Endoscopy was done at that time because of melena.  She had been taking ibuprofen for dental issues.  Endoscopy showed gastric polyps.  History of left lower lobe pneumonia August 2006.  Diverticulitis in 2001.  Had positive titer for measles in 1999.  She was hospitalized for chickenpox in the 25s.  Had mononucleosis in the 1990s.  Laparoscopic cholecystectomy March 1996.  History of migraine headaches.  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  She tried Topamax in the past as prescribed by Dr. Floyde Parkins, neurologist.  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  She previously worked for [employer] prior to her retirement.  She is a native of [place].  She attended [employer].  She is married to a retired Software engineer.  Her parents immigrated from [place].  She is an only child.  Non-smoker.  Social alcohol consumption.  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  Mother resides in [place] and lives alone in close proximity to the patient.   Family history: Father died of complications of congestive heart failure, history of chronic kidney disease and history of bladder cancer.  Mother with history of hypertension and hyperlipidemia doing well in her 79s.        Review of Systems no new complaints-denies chest pain, shortness of breath, abdominal issues, significant headaches   Objective    Vitals: BP 122/82   Pulse 82   Temp 98.5 F (36.9 C) (Tympanic)   Ht 5' 1.5" (1.562 m)   Wt 169 lb 12.8 oz (77 kg)   LMP 06/01/2013 Comment: spotting  SpO2 98%   BMI 31.56 kg/m   Physical Exam Skin: Warm and dry.  No cervical adenopathy.  No carotid bruits.  Chest clear.  Cardiac exam: Regular rate and rhythm without ectopy.  Breast are without masses.  Cardiac exam: Regular rate and rhythm without ectopy.  Abdomen is soft nondistended without hepatosplenomegaly masses or tenderness.  No lower extremity pitting edema today.  Brief neurological exam is intact  without gross focal deficits.  Affect thought and judgment are normal.  GYN exam deferred to Dr. Sabra Heck.   Most recent functional status assessment:    02/11/2022   10:15 AM  In your present state of health, do you have any difficulty performing the following activities:  Hearing? 0  Vision? 0  Difficulty concentrating or making decisions? 0  Walking or climbing stairs? 0  Dressing or bathing? 0  Doing errands, shopping? 0  Preparing Food and eating ? N  Using the Toilet? N  In  the past six months, have you accidently leaked urine? Y  Do you have problems with loss of bowel control? N  Managing your Medications? N  Managing your Finances? N  Housekeeping or managing your Housekeeping? N   Most recent fall risk assessment:    02/11/2022   10:14 AM  Fall Risk   Falls in the past year? 0  Number falls in past yr: 0  Injury with Fall? 0  Risk for fall due to : No Fall Risks  Follow up Falls prevention discussed    Most recent depression screenings:    02/11/2022   10:15 AM 07/17/2021   10:45 AM  PHQ 2/9 Scores  PHQ - 2 Score 0 0   Most recent cognitive screening:    02/11/2022   10:17 AM  6CIT Screen  What Year? 0 points  What month? 0 points  What time? 0 points  Count back from 20 0 points  Months in reverse 0 points  Repeat phrase 0 points  Total Score 0 points       Assessment & Plan  Essential hypertension-stable on current regimen of losartan 50 mg daily  Hyperlipidemia-lipid panel is normal on Pravachol 40 mg daily  Allergic rhinitis treated with Allegra  History of asthma-continue Advair 250/50 Diskus as needed  GE reflux stable on Nexium 20 mg daily  Hypothyroidism stable on levothyroxine 50 mcg daily  Dependent edema-intermittent  History of migraine headaches-infrequent  History of anxiety treated with as needed Xanax and Celexa  Plan: Immunizations reviewed and discussed.  Return in 1 year or as needed.  Colonoscopy was done in July 2023.  CT of chest to be done for follow-up on pulmonary nodules in February.  Mammogram due March 2024.         Annual wellness visit done today including the all of the following: Reviewed patient's Family Medical History Reviewed and updated list of patient's medical providers Assessment of cognitive impairment was done Assessed patient's functional ability Established a written schedule for health screening [place] Completed and Reviewed  Discussed health  benefits of physical activity, and encouraged her to engage in regular exercise appropriate for her age and condition.         {I, Elby Showers, MD, have reviewed all documentation for this visit. The documentation on 02/11/22 for the exam, diagnosis, procedures, and orders are all accurate and complete.   LaVon Barron Alvine, CMA

## 2022-02-11 NOTE — Progress Notes (Deleted)
IElby Showers, MD, have reviewed all documentation for this visit. The documentation on 02/11/22 for the exam, diagnosis, procedures, and orders are all accurate and complete.

## 2022-02-17 NOTE — Progress Notes (Signed)
Scheduled for Feb 12

## 2022-02-19 DIAGNOSIS — R928 Other abnormal and inconclusive findings on diagnostic imaging of breast: Secondary | ICD-10-CM | POA: Diagnosis not present

## 2022-02-19 DIAGNOSIS — N6321 Unspecified lump in the left breast, upper outer quadrant: Secondary | ICD-10-CM | POA: Diagnosis not present

## 2022-02-19 DIAGNOSIS — N6323 Unspecified lump in the left breast, lower outer quadrant: Secondary | ICD-10-CM | POA: Diagnosis not present

## 2022-02-19 LAB — HM MAMMOGRAPHY

## 2022-02-22 ENCOUNTER — Encounter: Payer: Self-pay | Admitting: Internal Medicine

## 2022-03-02 ENCOUNTER — Encounter (HOSPITAL_BASED_OUTPATIENT_CLINIC_OR_DEPARTMENT_OTHER): Payer: Self-pay | Admitting: Obstetrics & Gynecology

## 2022-03-02 ENCOUNTER — Encounter: Payer: Self-pay | Admitting: Internal Medicine

## 2022-03-12 ENCOUNTER — Encounter: Payer: Self-pay | Admitting: Internal Medicine

## 2022-03-15 ENCOUNTER — Ambulatory Visit
Admission: RE | Admit: 2022-03-15 | Discharge: 2022-03-15 | Disposition: A | Discharge status: Home / Self Care | Payer: Medicare HMO | Source: Ambulatory Visit | Attending: Internal Medicine | Admitting: Internal Medicine

## 2022-03-15 DIAGNOSIS — R911 Solitary pulmonary nodule: Secondary | ICD-10-CM | POA: Diagnosis not present

## 2022-03-15 DIAGNOSIS — R918 Other nonspecific abnormal finding of lung field: Secondary | ICD-10-CM | POA: Diagnosis not present

## 2022-03-16 ENCOUNTER — Telehealth: Payer: Self-pay | Admitting: Internal Medicine

## 2022-03-16 MED ORDER — LEVOTHYROXINE SODIUM 50 MCG PO TABS: 50.0000 ug | ORAL_TABLET | Freq: Every day | ORAL | 1 refills | Status: DC

## 2022-03-16 MED ORDER — CITALOPRAM HYDROBROMIDE 10 MG PO TABS: 10.0000 mg | ORAL_TABLET | Freq: Every day | ORAL | 3 refills | Status: DC

## 2022-03-16 MED ORDER — PRAVASTATIN SODIUM 40 MG PO TABS: ORAL_TABLET | ORAL | 3 refills | Status: DC

## 2022-03-16 MED ORDER — LOSARTAN POTASSIUM 25 MG PO TABS: 50.0000 mg | ORAL_TABLET | Freq: Every day | ORAL | 3 refills | Status: DC

## 2022-03-16 NOTE — Telephone Encounter (Signed)
Please send new prescriptions to [facility], [place] Phone: [phone]  Fax: [phone]     levothyroxine (SYNTHROID) 50 MCG tablet  citalopram (CELEXA) 10 MG tablet  pravastatin (PRAVACHOL) 40 MG tablet  losartan (COZAAR) 25 MG tablet   [facility]

## 2022-03-16 NOTE — Telephone Encounter (Signed)
Requested Rx has been sent to the pharmacy and [facility] been [facility]

## 2022-03-31 NOTE — Patient Instructions (Signed)
It was a pleasure to see you today.  Labs are stable.  Immunizations discussed.  Colonoscopy up-to-date.  CT of chest to be done for follow-up on pulmonary nodules in February.  Mammogram due March 2024.  GYN exam is up-to-date.  Mammogram is up-to-date.  Return in 1 year or as needed.  No change in medications.

## 2022-07-17 ENCOUNTER — Telehealth: Payer: Self-pay | Admitting: Internal Medicine

## 2022-07-17 ENCOUNTER — Encounter: Payer: Self-pay | Admitting: Internal Medicine

## 2022-07-17 DIAGNOSIS — M549 Dorsalgia, unspecified: Secondary | ICD-10-CM

## 2022-07-17 MED ORDER — HYDROCODONE-ACETAMINOPHEN 5-325 MG PO TABS: 1.0000 | ORAL_TABLET | Freq: Four times a day (QID) | ORAL | 0 refills | Status: DC | PRN

## 2022-07-17 NOTE — Telephone Encounter (Signed)
Recent trip to [place]. While there, developed acute low back pain. Needs refill on Norco 5/325 #15 tabs to take sparingly q  4-6 hours as needed for acute back pain. MJB, MD

## 2022-07-28 DIAGNOSIS — M5416 Radiculopathy, lumbar region: Secondary | ICD-10-CM | POA: Diagnosis not present

## 2022-08-03 DIAGNOSIS — M5416 Radiculopathy, lumbar region: Secondary | ICD-10-CM | POA: Diagnosis not present

## 2022-08-07 ENCOUNTER — Other Ambulatory Visit: Payer: Self-pay | Admitting: Internal Medicine

## 2022-08-12 NOTE — Progress Notes (Signed)
Patient Care Team: Margaree Mackintosh, MD as PCP - General (Internal Medicine) [place], Dennard Schaumann, RN as [facility] Care Management  Visit Date: 08/19/22  Subjective:    Patient ID: Donna Lovely, MD , Female   DOB: Aug 17, 1955, 67 y.o.    MRN: [id number]   67 y.o. Female presents today for a 6 month follow-up.  Her back pain is improved. Takes Tylenol as needed.  History of anxiety and depression treated with alprazolam 0.5 mg three times daily as needed, citalopram 10 mg daily.  History of GERD treated with esomeprazole 20 mg daily at noon.  History of hypertension treated with losartan 50 mg daily. Blood pressure normal today at 122/62.  History of hypothyroidism treated with levothyroxine 50 mcg daily. TSH at 2.62.  History of hyperlipidemia treated with pravastatin 40 mg daily at bedtime. Lipid panel normal.   Liver functions normal.   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  She is a native of [place].  She attended [employer].  She is married to a retired Teacher, early years/pre.  Her parents immigrated from [place].  She is an only child.  Non-smoker.  Social alcohol consumption.  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  Mother resides in [place] and lives alone in close proximity to the patient.   Family history: Father died of complications of congestive heart failure, history of chronic kidney disease and history of bladder cancer.  Mother with history of hypertension and hyperlipidemia doing well in her 5s.  Past Medical History:  Diagnosis Date   Anxiety    Asthma    Dyslipidemia    GERD (gastroesophageal reflux disease)    HTN (hypertension)    Hypothyroidism      Family History  Problem Relation Age of Onset   Coronary artery disease Father    COPD Father    Cancer Father        Bladder    Hyperlipidemia Mother    Hypertension Mother     Social History   Social History Narrative   MarriedPhysician    Social history: She is a retired Stage manager. Worked for [employer] of [place] prior to her retirement. Spends time with new grandchild, all of her, in [place] area. She is a native of [place]. She attended [employer]. She is married to a retired Teacher, early years/pre. Parents immigrated from [place]. She is an only child. Non-smoker. Social alcohol consumption. 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 Mother resides in [place].       Family history: Father died of complications of congestive heart failure, had history of chronic kidney disease and history of bladder cancer. Mother with history of hypertension and hyperlipidemia doing well in her 57s residing alone near her daughter.          Review of Systems  Constitutional:  Negative for fever and malaise/fatigue.  HENT:  Negative for congestion.   Eyes:  Negative for blurred vision.  Respiratory:  Negative for cough and shortness of breath.   Cardiovascular:  Negative for chest pain, palpitations and leg swelling.  Gastrointestinal:  Negative for vomiting.  Musculoskeletal:  Positive for back pain.  Skin:  Negative for rash.  Neurological:  Negative for loss of consciousness and headaches.        Objective:   Vitals: BP  122/62   Pulse 76   Resp 16   Ht 5' 1.5" (1.562 m)   Wt 171 lb 4 oz (77.7 kg)   LMP 06/01/2013 Comment: spotting  SpO2 97%   BMI 31.83 kg/m    Physical Exam Vitals and nursing note reviewed.  Constitutional:      General: She is not in acute distress.    Appearance: Normal appearance. She is not toxic-appearing.  HENT:     Head: Normocephalic and atraumatic.  Cardiovascular:     Rate and Rhythm: Normal rate and regular rhythm. No extrasystoles are present.    Pulses: Normal pulses.     Heart sounds: Normal heart sounds. No  murmur heard.    No friction rub. No gallop.  Pulmonary:     Effort: Pulmonary effort is normal. No respiratory distress.     Breath sounds: Normal breath sounds. No wheezing or rales.  Skin:    General: Skin is warm and dry.  Neurological:     Mental Status: She is alert and oriented to person, place, and time. Mental status is at baseline.  Psychiatric:        Mood and Affect: Mood normal.        Behavior: Behavior normal.        Thought Content: Thought content normal.        Judgment: Judgment normal.       Results:   Studies obtained and personally reviewed by me:   Labs:       Component Value Date/Time   NA 140 02/08/2022 0932   NA 140 11/25/2017 0943   K 4.2 02/08/2022 0932   CL 105 02/08/2022 0932   CO2 29 02/08/2022 0932   GLUCOSE 94 02/08/2022 0932   BUN 18 02/08/2022 0932   BUN 15 11/25/2017 0943   CREATININE 0.72 02/08/2022 0932   CALCIUM 9.6 02/08/2022 0932   PROT 6.6 08/17/2022 0913   PROT 7.4 11/25/2017 0943   ALBUMIN 4.6 11/25/2017 0943   AST 18 08/17/2022 0913   ALT 15 08/17/2022 0913   ALKPHOS 85 11/25/2017 0943   BILITOT 0.6 08/17/2022 0913   BILITOT 0.6 11/25/2017 0943   GFRNONAA 77 01/04/2020 0916   GFRAA 89 01/04/2020 0916     Lab Results  Component Value Date   WBC 6.1 02/08/2022   HGB 13.9 02/08/2022   HCT 40.6 02/08/2022   MCV 94.6 02/08/2022   PLT 265 02/08/2022    Lab Results  Component Value Date   CHOL 139 08/17/2022   HDL 70 08/17/2022   LDLCALC 55 08/17/2022   TRIG 65 08/17/2022   CHOLHDL 2.0 08/17/2022    No results found for: "HGBA1C"   Lab Results  Component Value Date   TSH 2.62 08/17/2022      Assessment & Plan:   Anxiety and depression: stable with alprazolam 0.5 mg three times daily as needed, citalopram 10 mg daily.  GERD: treated with esomeprazole 20 mg daily at noon.  Hypertension: treated with losartan 50 mg daily. Blood pressure normal today at 122/62.  Hypothyroidism: treated with  levothyroxine 50 mcg daily. TSH at 2.62.  Hyperlipidemia: treated with pravastatin 40 mg daily at bedtime. Lipid panel normal.   Back pain/  right lumbar radiculopathy- PT as needed.Had Right L4 and L5 SNRB at [facility] in late June  Return in 6 months for health maintenance exam or as needed.    I,Alexander Ruley,acting as a Neurosurgeon for Margaree Mackintosh, MD.,have documented all relevant documentation  on the behalf of Margaree Mackintosh, MD,as directed by  Margaree Mackintosh, MD while in the presence of Margaree Mackintosh, MD.   I, Margaree Mackintosh, MD, have reviewed all documentation for this visit. The documentation on 09/01/22 for the exam, diagnosis, procedures, and orders are all accurate and complete.

## 2022-08-17 ENCOUNTER — Encounter: Payer: Self-pay | Admitting: Internal Medicine

## 2022-08-17 ENCOUNTER — Other Ambulatory Visit: Payer: Medicare HMO

## 2022-08-17 DIAGNOSIS — E7800 Pure hypercholesterolemia, unspecified: Secondary | ICD-10-CM

## 2022-08-17 DIAGNOSIS — E039 Hypothyroidism, unspecified: Secondary | ICD-10-CM | POA: Diagnosis not present

## 2022-08-18 LAB — HEPATIC FUNCTION PANEL
AG Ratio: 1.8 (calc) (ref 1.0–2.5)
ALT: 15 U/L (ref 6–29)
AST: 18 U/L (ref 10–35)
Albumin: 4.2 g/dL (ref 3.6–5.1)
Alkaline phosphatase (APISO): 75 U/L (ref 37–153)
Bilirubin, Direct: 0.2 mg/dL (ref 0.0–0.2)
Globulin: 2.4 g/dL (calc) (ref 1.9–3.7)
Indirect Bilirubin: 0.4 mg/dL (calc) (ref 0.2–1.2)
Total Bilirubin: 0.6 mg/dL (ref 0.2–1.2)
Total Protein: 6.6 g/dL (ref 6.1–8.1)

## 2022-08-18 LAB — LIPID PANEL
Cholesterol: 139 mg/dL (ref <200)
HDL: 70 mg/dL (ref >=50)
LDL Cholesterol (Calc): 55 mg/dL (calc)
Non-HDL Cholesterol (Calc): 69 mg/dL (calc) (ref <130)
Total CHOL/HDL Ratio: 2 (calc) (ref <5.0)
Triglycerides: 65 mg/dL (ref <150)

## 2022-08-18 LAB — TSH: TSH: 2.62 mIU/L (ref 0.40–4.50)

## 2022-08-19 ENCOUNTER — Ambulatory Visit (INDEPENDENT_AMBULATORY_CARE_PROVIDER_SITE_OTHER): Payer: Medicare HMO | Admitting: Internal Medicine

## 2022-08-19 VITALS — BP 122/62 | HR 76 | Resp 16 | Ht 61.5 in | Wt 171.2 lb

## 2022-08-19 DIAGNOSIS — M5416 Radiculopathy, lumbar region: Secondary | ICD-10-CM

## 2022-08-19 DIAGNOSIS — F419 Anxiety disorder, unspecified: Secondary | ICD-10-CM

## 2022-08-19 DIAGNOSIS — K219 Gastro-esophageal reflux disease without esophagitis: Secondary | ICD-10-CM | POA: Diagnosis not present

## 2022-08-19 DIAGNOSIS — F32A Depression, unspecified: Secondary | ICD-10-CM | POA: Diagnosis not present

## 2022-08-19 DIAGNOSIS — I10 Essential (primary) hypertension: Secondary | ICD-10-CM | POA: Diagnosis not present

## 2022-08-19 DIAGNOSIS — E7800 Pure hypercholesterolemia, unspecified: Secondary | ICD-10-CM

## 2022-08-19 DIAGNOSIS — E039 Hypothyroidism, unspecified: Secondary | ICD-10-CM

## 2022-08-20 DIAGNOSIS — H2513 Age-related nuclear cataract, bilateral: Secondary | ICD-10-CM | POA: Diagnosis not present

## 2022-08-20 DIAGNOSIS — H5203 Hypermetropia, bilateral: Secondary | ICD-10-CM | POA: Diagnosis not present

## 2022-08-20 DIAGNOSIS — Z0100 Encounter for examination of eyes and vision without abnormal findings: Secondary | ICD-10-CM | POA: Diagnosis not present

## 2022-08-23 DIAGNOSIS — M545 Low back pain, unspecified: Secondary | ICD-10-CM | POA: Diagnosis not present

## 2022-08-23 DIAGNOSIS — M5416 Radiculopathy, lumbar region: Secondary | ICD-10-CM | POA: Diagnosis not present

## 2022-08-25 ENCOUNTER — Telehealth: Payer: Self-pay | Admitting: Internal Medicine

## 2022-08-25 ENCOUNTER — Encounter: Payer: Self-pay | Admitting: Internal Medicine

## 2022-08-25 ENCOUNTER — Telehealth (INDEPENDENT_AMBULATORY_CARE_PROVIDER_SITE_OTHER): Payer: Medicare HMO | Admitting: Internal Medicine

## 2022-08-25 DIAGNOSIS — K219 Gastro-esophageal reflux disease without esophagitis: Secondary | ICD-10-CM

## 2022-08-25 DIAGNOSIS — Z8709 Personal history of other diseases of the respiratory system: Secondary | ICD-10-CM

## 2022-08-25 DIAGNOSIS — E039 Hypothyroidism, unspecified: Secondary | ICD-10-CM

## 2022-08-25 DIAGNOSIS — E7800 Pure hypercholesterolemia, unspecified: Secondary | ICD-10-CM | POA: Diagnosis not present

## 2022-08-25 DIAGNOSIS — I10 Essential (primary) hypertension: Secondary | ICD-10-CM

## 2022-08-25 DIAGNOSIS — U071 COVID-19: Secondary | ICD-10-CM

## 2022-08-25 MED ORDER — NIRMATRELVIR/RITONAVIR (PAXLOVID)TABLET: 3.0000 | ORAL_TABLET | Freq: Two times a day (BID) | ORAL | 0 refills | Status: AC

## 2022-08-25 NOTE — Patient Instructions (Addendum)
We are sorry to hear you have COVID-19 virus infection.  Please stay well-hydrated,  monitor pulse oximetry and walk some to prevent atelectasis.  You will need to quarantine at home for about 5 days.  Your GFR is normal and you have been prescribed regular strength Paxlovid.  Please call back if you have any concerns or questions.

## 2022-08-25 NOTE — Progress Notes (Addendum)
   Subjective:    Patient ID: Maurilio Lovely, MD, female    DOB: [birth year], 67 y.o.   MRN: [id number]  HPI I connected with Dr. Rise Mu. Kings virtually today as she is tested positive for COVID-19.  She is at her home and I am at my office.  She is agreeable to visit in this format today.  She is identified using 2 identifiers as Dr. Rise Mu. Cadmus, a patient in this practice.  Patient and her husband visited with their daughter, son-in-law, and  2 grandchildren recently.  Patient and her husband have both tested positive for COVID-19 today.   Patient has had multiple COVID-19 vaccines, the last one on file in [facility]IC is October 2023.  Patient has a history of hypertension, hyperlipidemia, allergic rhinitis, GE reflux and hypothyroidism.  History of RSV complicated by bronchitis vs pneumonia in November 2022.  This was treated in [place] as she was visiting her daughter.  She was not hospitalized.  Sometimes has wheezing due to allergic rhinitis and has been allergy tested by Dr. [place] Callas.  Occasionally has dependent edema.  Has had some issues recently with sciatica.  History of left lower lobe pneumonia in 2006.  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  Does not smoke.  Family history: Father died of complications of congestive heart failure, history of chronic kidney disease and history of bladder cancer.  Mother with history of hypertension and hyperlipidemia doing well in her 46s.  Mother resides alone in another residence.  Review of Systems malaise, fatigue, no nausea or vomiting, decreased appetite, has head congestion and sore throat.  No shaking chills.  Does have headache.     Objective:   Physical Exam  Patient seen virtually.  She is in no acute distress.  Does not appear to be tachypneic but looks slightly fatigued and pale.      Assessment & Plan:  Acute COVID-19 virus infection  Essential  hypertension  Hyperlipidemia  Hypothyroidism  History of pneumonia in 2006 and RSV infection in 2022  History of asthma  Recent sciatica  Plan: Regular strength Paxlovid prescribed for patient.  Estimated GFR in January 2024 is 92 cc/min.  Recommend staying well-hydrated, rest, walk some to prevent atelectasis and monitoring pulse oximetry.  Quarantine for 5 days.  Time spent with this video visit which involved chart review including checking GFR and medication list, interviewing patient, and E- scribing medication is 20 minutes

## 2022-08-25 NOTE — Telephone Encounter (Signed)
Cire Deyarmin [phone]  Janaa called to say she is positive for COVID, she is achy, coughing. I let her know we would set her up for video visit.

## 2022-09-01 ENCOUNTER — Encounter: Payer: Self-pay | Admitting: Internal Medicine

## 2022-09-01 NOTE — Patient Instructions (Addendum)
It is pleasure to see you today for 48-month follow-up.  Labs are stable.  Blood pressure, TSH and lipid panel are all excellent.  Back pain is improving.  Return in 6 months for Medicare wellness and health maintenance exam.

## 2022-09-03 ENCOUNTER — Other Ambulatory Visit: Payer: Self-pay | Admitting: Internal Medicine

## 2022-11-27 ENCOUNTER — Other Ambulatory Visit: Payer: Self-pay | Admitting: Family

## 2022-11-27 ENCOUNTER — Other Ambulatory Visit: Payer: Self-pay | Admitting: Internal Medicine

## 2022-12-06 ENCOUNTER — Other Ambulatory Visit: Payer: Self-pay

## 2022-12-06 MED ORDER — FLUTICASONE-SALMETEROL 250-50 MCG/ACT IN AEPB: 1.0000 | INHALATION_SPRAY | Freq: Two times a day (BID) | RESPIRATORY_TRACT | 11 refills | Status: DC

## 2023-02-07 ENCOUNTER — Other Ambulatory Visit: Payer: Self-pay | Admitting: Internal Medicine

## 2023-02-15 ENCOUNTER — Other Ambulatory Visit: Payer: Medicare HMO

## 2023-02-15 ENCOUNTER — Other Ambulatory Visit: Payer: Self-pay | Admitting: Internal Medicine

## 2023-02-15 DIAGNOSIS — F32A Depression, unspecified: Secondary | ICD-10-CM

## 2023-02-15 DIAGNOSIS — E039 Hypothyroidism, unspecified: Secondary | ICD-10-CM

## 2023-02-15 DIAGNOSIS — E7800 Pure hypercholesterolemia, unspecified: Secondary | ICD-10-CM

## 2023-02-15 DIAGNOSIS — Z0000 Encounter for general adult medical examination without abnormal findings: Secondary | ICD-10-CM

## 2023-02-15 DIAGNOSIS — I10 Essential (primary) hypertension: Secondary | ICD-10-CM

## 2023-02-15 DIAGNOSIS — K219 Gastro-esophageal reflux disease without esophagitis: Secondary | ICD-10-CM

## 2023-02-15 NOTE — Progress Notes (Signed)
Annual Wellness Visit   Patient Care Team: Lysha Schrade, Luanna Cole, Donna Reynolds as PCP - General (Internal Medicine) Pleasant, Dennard Schaumann, RN as [facility] Care Management  Visit Date: 02/17/23   Chief Complaint  Patient presents with   Medicare Wellness   Subjective:  Patient: Donna Lovely, Donna Reynolds, Female DOB: 06-Mar-1955, 68 y.o. MRN: [id number]  Donna Lovely, Donna Reynolds is a 68 y.o. Female who presents today for her Annual Wellness Visit. Patient has history of Anxiety/Depression, Asthma, Hypothyroidism, Hypertension, GE Reflux, and Dyslipidemia.   History of Anxiety/Depression treated with 0.50 mg Xanax as needed up to 3 times daily for anxiety (hasn't been refilled since June 2021) and 10 mg Celexa daily. Reports some stress about her mother, who is 44 y.o, and constantly anxious. Has talked to mother about moving to a facility where she can be surrounded by peers, but she had declined unless necessary.   History of Asthma treated with 250-50 mcg Advair BID morning and night.  History of Hypothyroidism treated with 50 mcg Levothyroxine daily. 02/15/23 TSH 2.92  History of Hypertension treated with 50 mg Losartan daily. Blood pressure normotensive today at 110/80.     History of GE Reflux treated with 4 mg Zofran every 8 hours as needed for nausea/vomiting.   History of Dyslipidemia treated with 40 mg Pravastatin daily at bedtime. 02/15/23 Lipid Panel WNL.   She reports that she will be traveling to [place] soon and would like some Norco preemptively in case she develops acute pain.   Labs 02/15/23 CBC: WNL CMP: WNL HgbA1c: 5.6  Colonoscopy 08/19/21 with 1 Sessile Polyp removed from distal sigmoid colon; Small Diverticula throughout examined colon; Otherwise normal with repeat recommendation of 2028.   PAP Smear 07/17/21 was normal. Repeat discontinued due to age.   Mammogram 02/19/22 was normal with repeat recommendation of 2025 She reports that she has a lump in her  left breast that she has had checked twice, which has been determined to be a hematoma that developed after she slipped and fell in the shower. Does endorse some pain on palpation.   Bone DEXA 01/07/09 with lowest T-score of -0.7 and Z-score of 0.3, normal. Overdue for repeat. She is thinking about having this done when she has her mammogram.   Vaccine Counseling: UTD on her immunizations.  Past Medical History:  Diagnosis Date   Anxiety    Asthma    Dyslipidemia    GERD (gastroesophageal reflux disease)    HTN (hypertension)    Hypothyroidism   Medical/Surgical History Narrative: 2022 - RSV complicated by pneumonia in November.   2012 - Cardiac evaluation in February for chest pain including cardiac cath, which was negative.  2011 - Sciatica of the Right Leg for which Tramadol was prescribed.  2009 -  Endoscopy, due to melena, & Colonoscopy by Dr. Loreta Ave in 2009; she had been taking ibuprofen for dental issues; endoscopy showed Gastric Polyps.  2006 - History of Herpes Zoster. History of Left Lower Lobe Pneumonia in August.  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 Lesia Sago, Neurologist.   2001 - Diverticulitis  1990s (1990 - 1999) - Positive titer for Measles in 1999. Laparoscopic Cholecystectomy in March 1996. Mononucleosis.   1980s (1980 - 1989) -  hospitalized for Chickenpox   Other: History of Allergic Rhinitis seen by Dr. Ellijay Callas in the past and was found  to have positive skin test to house dust, dust mites, cockroach, mold mix, slight reaction to grasses. Spirometry was normal. Was advised to use inhalers as needed; sometimes has wheezing.   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 Family History  Problem Relation Age of Onset    Coronary artery disease Father    COPD Father    Cancer Father        Bladder   Hyperlipidemia Mother    Hypertension Mother   Family History Narrative:   Father died of complications of Congestive Heart Failure; history of Chronic Kidney Disease and history of Bladder Cancer.  Mother with history of Hypertension and Hyperlipidemia doing well in her 70s.  Social History   Social History Narrative   MarriedPhysician    Social history: She is a retired Stage manager. Worked for [employer] [place] prior to her retirement. Spends time with new grandchild, all of her, in [place] area. She is a native of [place]. She attended [employer]. She is married to a retired Teacher, early years/pre. Parents immigrated from [place]. She is an only child. Non-smoker. Social alcohol consumption. 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 Mother resides in [place].      02/17/2023 - Traveling to [place] soon.       Review of Systems  Constitutional:  Negative for chills, fever, malaise/fatigue and weight loss.  HENT:  Negative for hearing loss, sinus pain and sore throat.   Respiratory:  Negative for cough, hemoptysis and shortness of breath.   Cardiovascular:  Negative for chest pain, palpitations, leg swelling and PND.  Gastrointestinal:  Negative for abdominal pain, constipation, diarrhea, heartburn, nausea and vomiting.  Genitourinary:  Negative for dysuria, frequency and urgency.  Musculoskeletal:  Negative for back pain, myalgias and neck pain.  Skin:  Negative for itching and rash.  Neurological:  Negative for dizziness, tingling, seizures and headaches.  Endo/Heme/Allergies:  Negative for polydipsia.  Psychiatric/Behavioral:  Negative for depression. The patient is not nervous/anxious.     Objective:  Vitals: BP 110/80   Pulse 82   Ht 5' 1.5" (1.562 m)   Wt 178 lb (80.7 kg)   LMP 06/01/2013 Comment: spotting  SpO2 98%   BMI 33.09 kg/m   Physical Exam Vitals and nursing note reviewed.  Constitutional:      General: She is not in acute distress.    Appearance: Normal appearance. She is not ill-appearing or toxic-appearing.  HENT:     Head: Normocephalic and atraumatic.     Right Ear: Hearing, tympanic membrane, ear canal and external ear normal.     Left Ear: Hearing, tympanic membrane, ear canal and external ear normal.     Mouth/Throat:     Pharynx: Oropharynx is clear.  Eyes:     Extraocular Movements: Extraocular movements intact.     Pupils: Pupils are equal, round, and reactive to light.  Neck:     Thyroid: No thyroid mass, thyromegaly or thyroid tenderness.     Vascular: No carotid bruit.  Cardiovascular:     Rate and Rhythm: Normal rate and regular rhythm. No extrasystoles are present.    Pulses:          Dorsalis pedis pulses are 1+ on the right side and 1+ on the left side.     Heart sounds: Normal heart sounds. No murmur heard.  No friction rub. No gallop.  Pulmonary:     Effort: Pulmonary effort is normal.     Breath sounds: Normal breath sounds. No decreased breath sounds, wheezing, rhonchi or rales.  Chest:     Chest wall: No mass.  Breasts:    Right: Mass (minor mass, has been examined and determined to be a hematoma) present.  Abdominal:     Palpations: Abdomen is soft. There is no hepatomegaly, splenomegaly or mass.     Tenderness: There is no abdominal tenderness.     Hernia: No hernia is present.  Musculoskeletal:     Cervical back: Normal range of motion.     Right lower leg: No edema.     Left lower leg: No edema.  Lymphadenopathy:     Cervical: No cervical adenopathy.     Upper Body:     Right upper body: No supraclavicular adenopathy.     Left upper body: No supraclavicular adenopathy.  Skin:    General: Skin is warm and dry.  Neurological:     General: No focal deficit present.     Mental Status: She is alert and oriented to person, place, and time. Mental status is at  baseline.     Sensory: Sensation is intact.     Motor: Motor function is intact. No weakness.     Deep Tendon Reflexes: Reflexes are normal and symmetric.  Psychiatric:        Attention and Perception: Attention normal.        Mood and Affect: Mood normal.        Speech: Speech normal.        Behavior: Behavior normal.        Thought Content: Thought content normal.        Cognition and Memory: Cognition normal.        Judgment: Judgment normal.    Most recent functional status assessment:    02/17/2023    9:48 AM  In your present state of health, do you have any difficulty performing the following activities:  Hearing? 0  Vision? 0  Difficulty concentrating or making decisions? 0  Walking or climbing stairs? 1  Dressing or bathing? 0  Doing errands, shopping? 0  Preparing Food and eating ? N  Using the Toilet? N  In the past six months, have you accidently leaked urine? Y  Do you have problems with loss of bowel control? N  Managing your Medications? N  Managing your Finances? N  Housekeeping or managing your Housekeeping? N   Most recent fall risk assessment:    02/17/2023    9:49 AM  Fall Risk   Falls in the past year? 0  Number falls in past yr: 0  Injury with Fall? 0  Risk for fall due to : No Fall Risks  Follow up Falls prevention discussed;Education provided;Falls evaluation completed    Most recent depression screenings:    02/17/2023    9:57 AM 02/11/2022   10:15 AM  PHQ 2/9 Scores  PHQ - 2 Score 0 0   Most recent cognitive screening:    02/17/2023    9:57 AM  6CIT Screen  What Year? 0 points  What month? 0 points  What time? 0 points  Count back from 20 0 points  Months in reverse 0 points  Repeat phrase 0 points  Total Score 0 points   Results:  Studies obtained and personally reviewed by me:  Colonoscopy 08/19/21 with 1 Sessile Polyp removed from distal  sigmoid colon; Small Diverticula throughout examined colon; Otherwise normal   PAP Smear  07/17/21 was normal.   Mammogram 02/19/22 was normal with repeat recommendation of 2025.   Bone DEXA 01/07/09 with lowest T-score of -0.7 and Z-score of 0.3, normal.   Labs:     Component Value Date/Time   NA 142 02/15/2023 0903   NA 140 11/25/2017 0943   K 4.7 02/15/2023 0903   CL 103 02/15/2023 0903   CO2 30 02/15/2023 0903   GLUCOSE 96 02/15/2023 0903   BUN 21 02/15/2023 0903   BUN 15 11/25/2017 0943   CREATININE 0.62 02/15/2023 0903   CALCIUM 9.9 02/15/2023 0903   PROT 6.8 02/15/2023 0903   PROT 7.4 11/25/2017 0943   ALBUMIN 4.6 11/25/2017 0943   AST 16 02/15/2023 0903   ALT 11 02/15/2023 0903   ALKPHOS 85 11/25/2017 0943   BILITOT 0.6 02/15/2023 0903   BILITOT 0.6 11/25/2017 0943   GFRNONAA 77 01/04/2020 0916   GFRAA 89 01/04/2020 0916     Lab Results  Component Value Date   WBC 6.5 02/15/2023   HGB 14.0 02/15/2023   HCT 42.9 02/15/2023   MCV 96.8 02/15/2023   PLT 258 02/15/2023    Lab Results  Component Value Date   CHOL 143 02/15/2023   HDL 63 02/15/2023   LDLCALC 62 02/15/2023   TRIG 93 02/15/2023   CHOLHDL 2.3 02/15/2023    Lab Results  Component Value Date   HGBA1C 5.6 02/15/2023     Lab Results  Component Value Date   TSH 2.92 02/15/2023   Assessment & Plan:  Anxiety/Depression treated with 0.50 mg Xanax as needed up to 3 times daily for anxiety and 10 mg Celexa daily. Some acute stress with her mother, but is otherwise doing well.    Asthma treated with 250-50 mcg Advair BID morning and night.  Hypothyroidism treated with 50 mcg Levothyroxine daily. 02/15/23 TSH 2.92  Hypertension treated with 50 mg Losartan daily. Blood pressure normotensive today at 110/80.     GE Reflux treated with 4 mg Zofran every 8 hours as needed for nausea/vomiting.   Dyslipidemia treated with 40 mg Pravastatin daily at bedtime. 02/15/23 Lipid Panel WNL.   Colonoscopy 08/19/21 with 1 Sessile Polyp removed from distal sigmoid colon; Small Diverticula throughout  examined colon; Otherwise normal with repeat recommendation of 2028.   PAP Smear 07/17/21 was normal. Repeat discontinued due to age.   Mammogram 02/19/22 was normal with repeat recommendation of 2025. Mentions recent possible hematoma discovered in left breast following a fall in her shower, will be re-evaluated at her next mammogram.   Bone DEXA 01/07/09 with lowest T-score of -0.7 and Z-score of 0.3, normal. Overdue for repeat. She is thinking about having this done when she has her mammogram.  Upcoming Travel that will include a lot of walking. Sending in 5-325 mg Norco - take 1 (one) tablet every 6 hours as needed for acute pain    Vaccine Counseling: UTD on her immunizations.     Annual wellness visit done today including the all of the following: Reviewed patient's Family Medical History Reviewed and updated list of patient's medical providers Assessment of cognitive impairment was done Assessed patient's functional ability Established a written schedule for health screening services Health Risk Assessent Completed and Reviewed  Discussed health benefits of physical activity, and encouraged her to engage in regular exercise appropriate for her age and condition.        I,Emily Lagle,acting as a  scribe for Margaree Mackintosh, Donna Reynolds.,have documented all relevant documentation on the behalf of Margaree Mackintosh, Donna Reynolds,as directed by  Margaree Mackintosh, Donna Reynolds while in the presence of Margaree Mackintosh, Donna Reynolds.   I, Margaree Mackintosh, Donna Reynolds, have reviewed all documentation for this visit. The documentation on 02/28/23 for the exam, diagnosis, procedures, and orders are all accurate and complete.

## 2023-02-16 LAB — CBC WITH DIFFERENTIAL/PLATELET
Absolute Lymphocytes: 2373 {cells}/uL (ref 850–3900)
Absolute Monocytes: 618 {cells}/uL (ref 200–950)
Basophils Absolute: 59 {cells}/uL (ref 0–200)
Basophils Relative: 0.9 %
Eosinophils Absolute: 228 {cells}/uL (ref 15–500)
Eosinophils Relative: 3.5 %
HCT: 42.9 % (ref 35.0–45.0)
Hemoglobin: 14 g/dL (ref 11.7–15.5)
MCH: 31.6 pg (ref 27.0–33.0)
MCHC: 32.6 g/dL (ref 32.0–36.0)
MCV: 96.8 fL (ref 80.0–100.0)
MPV: 10.9 fL (ref 7.5–12.5)
Monocytes Relative: 9.5 %
Neutro Abs: 3224 {cells}/uL (ref 1500–7800)
Neutrophils Relative %: 49.6 %
Platelets: 258 Thousand/uL (ref 140–400)
RBC: 4.43 Million/uL (ref 3.80–5.10)
RDW: 12.7 % (ref 11.0–15.0)
Total Lymphocyte: 36.5 %
WBC: 6.5 Thousand/uL (ref 3.8–10.8)

## 2023-02-16 LAB — COMPLETE METABOLIC PANEL WITHOUT GFR
AG Ratio: 1.8 (calc) (ref 1.0–2.5)
ALT: 11 U/L (ref 6–29)
AST: 16 U/L (ref 10–35)
Albumin: 4.4 g/dL (ref 3.6–5.1)
Alkaline phosphatase (APISO): 81 U/L (ref 37–153)
BUN: 21 mg/dL (ref 7–25)
CO2: 30 mmol/L (ref 20–32)
Calcium: 9.9 mg/dL (ref 8.6–10.4)
Chloride: 103 mmol/L (ref 98–110)
Creat: 0.62 mg/dL (ref 0.50–1.05)
Globulin: 2.4 g/dL (ref 1.9–3.7)
Glucose, Bld: 96 mg/dL (ref 65–99)
Potassium: 4.7 mmol/L (ref 3.5–5.3)
Sodium: 142 mmol/L (ref 135–146)
Total Bilirubin: 0.6 mg/dL (ref 0.2–1.2)
Total Protein: 6.8 g/dL (ref 6.1–8.1)
eGFR: 98 mL/min/1.73m2

## 2023-02-16 LAB — TSH: TSH: 2.92 m[IU]/L (ref 0.40–4.50)

## 2023-02-16 LAB — LIPID PANEL
Cholesterol: 143 mg/dL (ref <200)
HDL: 63 mg/dL (ref >=50)
LDL Cholesterol (Calc): 62 mg/dL
Non-HDL Cholesterol (Calc): 80 mg/dL (ref <130)
Total CHOL/HDL Ratio: 2.3 (calc) (ref <5.0)
Triglycerides: 93 mg/dL (ref <150)

## 2023-02-16 LAB — HEMOGLOBIN A1C
Hgb A1c MFr Bld: 5.6 %{Hb} (ref <5.7)
Mean Plasma Glucose: 114 mg/dL
eAG (mmol/L): 6.3 mmol/L

## 2023-02-17 ENCOUNTER — Ambulatory Visit (INDEPENDENT_AMBULATORY_CARE_PROVIDER_SITE_OTHER): Payer: Medicare HMO | Admitting: Internal Medicine

## 2023-02-17 ENCOUNTER — Encounter: Payer: Self-pay | Admitting: Internal Medicine

## 2023-02-17 VITALS — BP 110/80 | HR 82 | Ht 61.5 in | Wt 178.0 lb

## 2023-02-17 DIAGNOSIS — J309 Allergic rhinitis, unspecified: Secondary | ICD-10-CM

## 2023-02-17 DIAGNOSIS — Z0000 Encounter for general adult medical examination without abnormal findings: Secondary | ICD-10-CM

## 2023-02-17 DIAGNOSIS — Z8709 Personal history of other diseases of the respiratory system: Secondary | ICD-10-CM

## 2023-02-17 DIAGNOSIS — E039 Hypothyroidism, unspecified: Secondary | ICD-10-CM | POA: Diagnosis not present

## 2023-02-17 DIAGNOSIS — F32A Depression, unspecified: Secondary | ICD-10-CM | POA: Diagnosis not present

## 2023-02-17 DIAGNOSIS — I10 Essential (primary) hypertension: Secondary | ICD-10-CM

## 2023-02-17 DIAGNOSIS — E7800 Pure hypercholesterolemia, unspecified: Secondary | ICD-10-CM

## 2023-02-17 DIAGNOSIS — F419 Anxiety disorder, unspecified: Secondary | ICD-10-CM

## 2023-02-17 DIAGNOSIS — K219 Gastro-esophageal reflux disease without esophagitis: Secondary | ICD-10-CM | POA: Diagnosis not present

## 2023-02-17 DIAGNOSIS — M5416 Radiculopathy, lumbar region: Secondary | ICD-10-CM | POA: Diagnosis not present

## 2023-02-18 MED ORDER — HYDROCODONE-ACETAMINOPHEN 5-325 MG PO TABS: 1.0000 | ORAL_TABLET | Freq: Four times a day (QID) | ORAL | 0 refills | Status: DC | PRN

## 2023-02-21 ENCOUNTER — Other Ambulatory Visit: Payer: Self-pay

## 2023-02-21 MED ORDER — LEVOTHYROXINE SODIUM 50 MCG PO TABS: 50.0000 ug | ORAL_TABLET | Freq: Every day | ORAL | 0 refills | Status: DC

## 2023-02-28 ENCOUNTER — Encounter: Payer: Self-pay | Admitting: Internal Medicine

## 2023-02-28 NOTE — Patient Instructions (Addendum)
It was a pleasure to see you today as always.  Labs are all within normal limits.  Medications reviewed.  You have GYN appointment in June with Dr. Hyacinth Meeker.  Please return here in July for follow-up.  Hydrocodone APAP refilled for upcoming trip to [place].

## 2023-03-15 ENCOUNTER — Telehealth: Payer: Self-pay | Admitting: Internal Medicine

## 2023-03-15 NOTE — Telephone Encounter (Signed)
Copied from CRM [id number]. Topic: Clinical - Request for Lab/Test Order >> Mar 15, 2023  9:59 AM Elle L wrote: Reason for CRM: The patient is requesting an order for a bone density test for [facility] Address: [address] [place], [place], [place] [address] Phone: [phone]. It is scheduled for March 3rd.

## 2023-04-04 DIAGNOSIS — M8588 Other specified disorders of bone density and structure, other site: Secondary | ICD-10-CM | POA: Diagnosis not present

## 2023-04-04 DIAGNOSIS — Z7952 Long term (current) use of systemic steroids: Secondary | ICD-10-CM | POA: Diagnosis not present

## 2023-04-04 DIAGNOSIS — Z1231 Encounter for screening mammogram for malignant neoplasm of breast: Secondary | ICD-10-CM | POA: Diagnosis not present

## 2023-04-04 LAB — HM DEXA SCAN

## 2023-04-04 LAB — HM MAMMOGRAPHY

## 2023-04-05 ENCOUNTER — Encounter: Payer: Self-pay | Admitting: Internal Medicine

## 2023-04-19 DIAGNOSIS — M5416 Radiculopathy, lumbar region: Secondary | ICD-10-CM | POA: Diagnosis not present

## 2023-04-22 ENCOUNTER — Encounter: Payer: Self-pay | Admitting: Internal Medicine

## 2023-04-22 ENCOUNTER — Telehealth: Payer: Self-pay | Admitting: Internal Medicine

## 2023-04-22 MED ORDER — HYDROCODONE-ACETAMINOPHEN 5-325 MG PO TABS: 1.0000 | ORAL_TABLET | Freq: Four times a day (QID) | ORAL | 0 refills | Status: DC | PRN

## 2023-04-22 MED ORDER — AMOXICILLIN-POT CLAVULANATE 500-125 MG PO TABS: 1.0000 | ORAL_TABLET | Freq: Three times a day (TID) | ORAL | 0 refills | Status: DC

## 2023-04-22 NOTE — Telephone Encounter (Signed)
 Refill Hydrocodone APAP 5/325 to take as needed for back pain with travel #15 tabs no refill. Augmentin 500 mg twice daily x 10 days for cat bite. MJB, MD

## 2023-04-25 ENCOUNTER — Telehealth: Payer: Self-pay | Admitting: Internal Medicine

## 2023-04-25 MED ORDER — ONDANSETRON HCL 4 MG PO TABS: 4.0000 mg | ORAL_TABLET | Freq: Three times a day (TID) | ORAL | 0 refills | Status: AC | PRN

## 2023-04-25 MED ORDER — SCOPOLAMINE 1 MG/3DAYS TD PT72: 1.0000 | MEDICATED_PATCH | TRANSDERMAL | 1 refills | Status: DC

## 2023-04-25 NOTE — Telephone Encounter (Addendum)
 Dr Alden Hipp [phone]  Dr Cori Razor called to say she still needs below medications sent to pharmacy.  ondansetron (ZOFRAN) 4 MG tablet   Transderm scope patch, but I do not see that on her list old or current.   [facility] [address] [place], [place] - [address] Phone: [phone]  Fax: [phone]      These prescriptions have been e-scribed today. MJB, MD

## 2023-04-25 NOTE — Telephone Encounter (Signed)
 Called patient to let her know RX had been sent to pharmacy

## 2023-05-10 DIAGNOSIS — M5416 Radiculopathy, lumbar region: Secondary | ICD-10-CM | POA: Diagnosis not present

## 2023-05-10 DIAGNOSIS — M8580 Other specified disorders of bone density and structure, unspecified site: Secondary | ICD-10-CM | POA: Diagnosis not present

## 2023-05-10 DIAGNOSIS — M79651 Pain in right thigh: Secondary | ICD-10-CM | POA: Diagnosis not present

## 2023-06-07 DIAGNOSIS — L570 Actinic keratosis: Secondary | ICD-10-CM | POA: Diagnosis not present

## 2023-06-07 DIAGNOSIS — L820 Inflamed seborrheic keratosis: Secondary | ICD-10-CM | POA: Diagnosis not present

## 2023-06-07 DIAGNOSIS — L719 Rosacea, unspecified: Secondary | ICD-10-CM | POA: Diagnosis not present

## 2023-07-22 ENCOUNTER — Ambulatory Visit (HOSPITAL_BASED_OUTPATIENT_CLINIC_OR_DEPARTMENT_OTHER): Payer: Medicare HMO | Admitting: Obstetrics & Gynecology

## 2023-07-24 ENCOUNTER — Other Ambulatory Visit: Payer: Self-pay | Admitting: Internal Medicine

## 2023-08-07 ENCOUNTER — Other Ambulatory Visit: Payer: Self-pay | Admitting: Internal Medicine

## 2023-08-15 ENCOUNTER — Other Ambulatory Visit: Payer: Medicare HMO

## 2023-08-19 ENCOUNTER — Ambulatory Visit: Payer: Medicare HMO | Admitting: Internal Medicine

## 2023-08-22 DIAGNOSIS — H25013 Cortical age-related cataract, bilateral: Secondary | ICD-10-CM | POA: Diagnosis not present

## 2023-08-22 DIAGNOSIS — H2513 Age-related nuclear cataract, bilateral: Secondary | ICD-10-CM | POA: Diagnosis not present

## 2023-08-22 DIAGNOSIS — H5203 Hypermetropia, bilateral: Secondary | ICD-10-CM | POA: Diagnosis not present

## 2023-08-23 ENCOUNTER — Other Ambulatory Visit

## 2023-08-23 DIAGNOSIS — E039 Hypothyroidism, unspecified: Secondary | ICD-10-CM | POA: Diagnosis not present

## 2023-08-23 DIAGNOSIS — E7800 Pure hypercholesterolemia, unspecified: Secondary | ICD-10-CM | POA: Diagnosis not present

## 2023-08-24 ENCOUNTER — Ambulatory Visit: Payer: Self-pay | Admitting: Internal Medicine

## 2023-08-24 LAB — HEPATIC FUNCTION PANEL
AG Ratio: 1.8 (calc) (ref 1.0–2.5)
ALT: 11 U/L (ref 6–29)
AST: 16 U/L (ref 10–35)
Albumin: 4.3 g/dL (ref 3.6–5.1)
Alkaline phosphatase (APISO): 85 U/L (ref 37–153)
Bilirubin, Direct: 0.1 mg/dL (ref 0.0–0.2)
Globulin: 2.4 g/dL (ref 1.9–3.7)
Indirect Bilirubin: 0.3 mg/dL (ref 0.2–1.2)
Total Bilirubin: 0.4 mg/dL (ref 0.2–1.2)
Total Protein: 6.7 g/dL (ref 6.1–8.1)

## 2023-08-24 LAB — LIPID PANEL
Cholesterol: 132 mg/dL (ref <200)
HDL: 70 mg/dL (ref >=50)
LDL Cholesterol (Calc): 49 mg/dL
Non-HDL Cholesterol (Calc): 62 mg/dL (ref <130)
Total CHOL/HDL Ratio: 1.9 (calc) (ref <5.0)
Triglycerides: 54 mg/dL (ref <150)

## 2023-08-24 LAB — TSH: TSH: 1.99 m[IU]/L (ref 0.40–4.50)

## 2023-08-25 ENCOUNTER — Ambulatory Visit: Payer: Medicare HMO | Admitting: Internal Medicine

## 2023-08-25 DIAGNOSIS — L810 Postinflammatory hyperpigmentation: Secondary | ICD-10-CM | POA: Diagnosis not present

## 2023-08-25 DIAGNOSIS — L570 Actinic keratosis: Secondary | ICD-10-CM | POA: Diagnosis not present

## 2023-08-25 DIAGNOSIS — T50905A Adverse effect of unspecified drugs, medicaments and biological substances, initial encounter: Secondary | ICD-10-CM | POA: Diagnosis not present

## 2023-09-05 ENCOUNTER — Ambulatory Visit: Admitting: Internal Medicine

## 2023-09-05 ENCOUNTER — Encounter: Payer: Self-pay | Admitting: Internal Medicine

## 2023-09-05 ENCOUNTER — Ambulatory Visit (INDEPENDENT_AMBULATORY_CARE_PROVIDER_SITE_OTHER): Admitting: Internal Medicine

## 2023-09-05 VITALS — BP 110/70 | HR 70 | Ht 61.5 in | Wt 183.0 lb

## 2023-09-05 DIAGNOSIS — Z8709 Personal history of other diseases of the respiratory system: Secondary | ICD-10-CM | POA: Diagnosis not present

## 2023-09-05 DIAGNOSIS — F32A Depression, unspecified: Secondary | ICD-10-CM

## 2023-09-05 DIAGNOSIS — M5416 Radiculopathy, lumbar region: Secondary | ICD-10-CM | POA: Diagnosis not present

## 2023-09-05 DIAGNOSIS — E039 Hypothyroidism, unspecified: Secondary | ICD-10-CM | POA: Diagnosis not present

## 2023-09-05 DIAGNOSIS — K219 Gastro-esophageal reflux disease without esophagitis: Secondary | ICD-10-CM

## 2023-09-05 DIAGNOSIS — F419 Anxiety disorder, unspecified: Secondary | ICD-10-CM

## 2023-09-05 DIAGNOSIS — E7800 Pure hypercholesterolemia, unspecified: Secondary | ICD-10-CM | POA: Diagnosis not present

## 2023-09-05 DIAGNOSIS — I10 Essential (primary) hypertension: Secondary | ICD-10-CM

## 2023-09-05 MED ORDER — ALPRAZOLAM 0.5 MG PO TABS: 0.5000 mg | ORAL_TABLET | Freq: Three times a day (TID) | ORAL | 0 refills | Status: AC | PRN

## 2023-09-05 MED ORDER — TRIAMCINOLONE ACETONIDE 0.1 % EX CREA: 1.0000 | TOPICAL_CREAM | Freq: Three times a day (TID) | CUTANEOUS | 3 refills | Status: AC

## 2023-09-05 MED ORDER — HYDROCODONE-ACETAMINOPHEN 10-325 MG PO TABS: 1.0000 | ORAL_TABLET | Freq: Three times a day (TID) | ORAL | 0 refills | Status: AC | PRN

## 2023-09-05 MED ORDER — DULOXETINE HCL 30 MG PO CPEP: 30.0000 mg | ORAL_CAPSULE | Freq: Every day | ORAL | 1 refills | Status: DC

## 2023-09-05 NOTE — Progress Notes (Signed)
 Patient Care Team: Perri Ronal PARAS, MD as PCP - General (Internal Medicine) Pleasant, Cathlean DEL, RN as [facility] Care Management  Visit Date: 09/05/23  Subjective:   Chief Complaint  Patient presents with   Hypertension   Hyperlipidemia   Hypothyroidism  Patient PI:Donna Mazzocchi Karren, MD,Female DOB:02-04-1955,68 y.o. FMW:[id number]   68 y.o.Female presents today for 6 month follow-up for Hypertension; Hyperlipidemia; Hypothyroidism. Patient has a past medical history of Anxiety/Depression; Asthma; and GERD. Seen for annual visit on 02/17/2023, in the interim has been seen for Mammogram & Bone Density, Orthopedics, and Dermatology.  04/04/2023 Mammogram normal with repeat recommendation of 2026; Bone Density w/ T-score Right Femoral Neck -1.2, osteopenic.  History of Hypertension treated with Losartan  50 mg daily. Blood Pressure: normotensive today at 110/70.  History of Hyperlipidemia treated with Pravastatin  40 mg at bedtime. 08/23/2023 Lipid Panel: WNL. 2023 Coronary Calcium Score: 0.  History of Hypothyroidism treated with Levothyroxine  50 mcg daily. 08/23/2023 TSH: 1.99  Reviewed 08/23/2023 Hepatic Function Panel: WNL.   History of Anxiety/Depression treated with Xanax  0.50 mg as needed up to 3 times daily and Celexa  10 mg daily. Discussed her situational stress regarding her elderly mother's own anxiety and personal stressors.  History of Asthma treated with Advair  inhaler twice daily.   History of GERD treated with Nexium 20 mg daily and Zofran  as needed  History of Back Pain managed with OTC Tylenol  daily as needed and was started on Neurontin 300 mg three times daily, however could not tolerate this due to drowsiness and dizziness; has also had 3 injections, which initially helped but no longer are, and has done PT twice, which helps temporarily. She admits that she finds it difficult to walk long distances.  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 Spends time with new grandchild, all of her, in [place] area. She is a native of [place]. She attended [employer]. She is married to a retired Teacher, early years/pre. Parents immigrated from [place]. She is an only child. Non-smoker. Social alcohol consumption. 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 Mother resides in [place].       Family history: Father died of complications of congestive heart failure, had history of chronic kidney disease and history of bladder cancer. Mother with history of hypertension and hyperlipidemia doing well in her 13s residing alone near her daughter.       Review of Systems  Constitutional:  Negative for fever and malaise/fatigue.  HENT:  Negative for congestion.   Eyes:  Negative for blurred vision.  Respiratory:  Negative for cough and shortness of breath.   Cardiovascular:  Negative for chest pain, palpitations and leg swelling.  Gastrointestinal:  Negative for vomiting.  Musculoskeletal:  Positive for back pain.  Skin:  Negative for rash.  Neurological:  Negative for loss of consciousness and  headaches.     Objective:  Vitals: BP 110/70   Pulse 70   Ht 5' 1.5 (1.562 m)   Wt 183 lb (83 kg)   LMP 06/01/2013 Comment: spotting  SpO2 98%   BMI 34.02 kg/m   Physical Exam Vitals and nursing note reviewed.  Constitutional:      General: She is not in acute distress.    Appearance: Normal appearance. She  is not toxic-appearing.  HENT:     Head: Normocephalic and atraumatic.  Cardiovascular:     Rate and Rhythm: Normal rate and regular rhythm. No extrasystoles are present.    Pulses: Normal pulses.     Heart sounds: Normal heart sounds. No murmur heard.    No friction rub. No gallop.  Pulmonary:     Effort: Pulmonary effort is normal. No respiratory distress.     Breath sounds: Normal breath sounds. No wheezing or rales.  Skin:    General: Skin is warm and dry.  Neurological:     Mental Status: She is alert and oriented to person, place, and time. Mental status is at baseline.  Psychiatric:        Mood and Affect: Mood normal.        Behavior: Behavior normal.        Thought Content: Thought content normal.        Judgment: Judgment normal.     Results:  Studies Obtained And Personally Reviewed By Me:  04/04/2023 Mammogram normal with repeat recommendation of 2026. Bone Density w/ T-score Right Femoral Neck -1.2, osteopenic.  Labs:     Component Value Date/Time   NA 142 02/15/2023 0903   NA 140 11/25/2017 0943   K 4.7 02/15/2023 0903   CL 103 02/15/2023 0903   CO2 30 02/15/2023 0903   GLUCOSE 96 02/15/2023 0903   BUN 21 02/15/2023 0903   BUN 15 11/25/2017 0943   CREATININE 0.62 02/15/2023 0903   CALCIUM 9.9 02/15/2023 0903   PROT 6.7 08/23/2023 0920   PROT 7.4 11/25/2017 0943   ALBUMIN 4.6 11/25/2017 0943   AST 16 08/23/2023 0920   ALT 11 08/23/2023 0920   ALKPHOS 85 11/25/2017 0943   BILITOT 0.4 08/23/2023 0920   BILITOT 0.6 11/25/2017 0943   GFRNONAA 77 01/04/2020 0916   GFRAA 89 01/04/2020 0916    Lab Results  Component Value Date   WBC 6.5 02/15/2023   HGB 14.0 02/15/2023   HCT 42.9 02/15/2023   MCV 96.8 02/15/2023   PLT 258 02/15/2023   Lab Results  Component Value Date   CHOL 132 08/23/2023   HDL 70 08/23/2023   LDLCALC 49 08/23/2023   TRIG 54 08/23/2023   CHOLHDL 1.9 08/23/2023   Lab Results  Component Value Date   HGBA1C 5.6 02/15/2023     Lab Results  Component Value Date   TSH 1.99 08/23/2023    Assessment & Plan:   Meds ordered this encounter  Medications   ALPRAZolam  (XANAX ) 0.5 MG tablet    Sig: Take 1 tablet (0.5 mg total) by mouth 3 (three) times daily as needed for anxiety.    Dispense:  30 tablet    Refill:  0   triamcinolone  cream (KENALOG ) 0.1 %    Sig: Apply 1 Application topically 3 (three) times daily.    Dispense:  80 g    Refill:  3   DULoxetine  (CYMBALTA ) 30 MG capsule    Sig: Take 1 capsule (30 mg total) by mouth daily.  Dispense:  90 capsule    Refill:  1   HYDROcodone -acetaminophen  (NORCO) 10-325 MG tablet    Sig: Take 1 tablet by mouth every 8 (eight) hours as needed for up to 5 days.    Dispense:  15 tablet    Refill:  0   Hypertension treated with Losartan  50 mg daily. Blood Pressure: normotensive today at 110/70.  Hyperlipidemia treated with Pravastatin  40 mg at bedtime. 08/23/2023 Lipid Panel: WNL. 2023 Coronary Calcium Score: 0.  Hypothyroidism treated with Levothyroxine  50 mcg daily. 08/23/2023 TSH: 1.99  Reviewed 08/23/2023 Hepatic Function Panel: WNL.   Anxiety/Depression treated with Xanax  0.50 mg as needed up to 3 times daily and Celexa  10 mg daily. Discussed her situational stress regarding her elderly mother's own anxiety and personal stressors.  Refilling Xanax  0.5 mg. Increasing Cymbalta  to 30 mg to hopefully help manage her back pain better.   Asthma treated with Advair  inhaler twice daily.   GERD treated with Nexium 20 mg daily and Zofran  as needed  Back Pain(right lumbar radiculopathy) recently seen at [facility] in early August - managed with OTC Tylenol  daily as needed and was started on Neurontin 300 mg three times daily, however could not tolerate this due to drowsiness and dizziness; has also had 3 injections, which initially helped but no longer are, and has done PT twice, which helps temporarily. She admits that she finds it difficult to walk long distances. No  recent MRI and she is not interested in surgery so medical management is appropriate  She has taken Norco 10-325 mg before - PDMP reviewed: - sending in Norco 10-325 mg #15 tablets to take as needed every 8 hours.   04/04/2023 Mammogram normal with repeat recommendation of 2026; Bone Density w/ T-score Right Femoral Neck -1.2, osteopenic.  Per patient request, refilling Triamcinolone  cream.   Return in about 6 months (around 03/01/2024) for annual labs, and then on 03/05/2024 for annual visit, or as needed.   I,Emily Lagle,acting as a Neurosurgeon for Ronal JINNY Hailstone, MD.,have documented all relevant documentation on the behalf of Ronal JINNY Hailstone, MD,as directed by  Ronal JINNY Hailstone, MD while in the presence of Ronal JINNY Hailstone, MD.   I, Ronal JINNY Hailstone, MD, have reviewed all documentation for this visit. The documentation on 09/05/2023 for the exam, diagnosis, procedures, and orders are all accurate and complete.

## 2023-09-13 ENCOUNTER — Encounter: Payer: Self-pay | Admitting: Internal Medicine

## 2023-09-13 NOTE — Patient Instructions (Signed)
 It was good to see you today. Labs are stable. Have refilled Norco to take sparingly as needed for lumbar back pain. No change in meds. Please return in 6 months for medicare wellness and health maintenance exam.

## 2023-10-10 ENCOUNTER — Encounter: Payer: Self-pay | Admitting: Internal Medicine

## 2023-10-10 ENCOUNTER — Telehealth: Payer: Self-pay | Admitting: Internal Medicine

## 2023-10-10 MED ORDER — DULOXETINE HCL 60 MG PO CPEP: 60.0000 mg | ORAL_CAPSULE | Freq: Every day | ORAL | 0 refills | Status: DC

## 2023-10-10 MED ORDER — LOSARTAN POTASSIUM 100 MG PO TABS: 100.0000 mg | ORAL_TABLET | Freq: Every day | ORAL | 0 refills | Status: DC

## 2023-10-10 NOTE — Telephone Encounter (Signed)
 Situational stress with daughter and son-in-law living in the [place] area. Patient says her BP has been elevated and she  also has been having a lot of anxiety. Increasing  losartan  to 100 mg daily.  She is currently out of town  with her daughter and family. Refilling Cymbalta  as requested.

## 2023-11-04 ENCOUNTER — Other Ambulatory Visit: Payer: Self-pay | Admitting: Internal Medicine

## 2023-11-06 ENCOUNTER — Other Ambulatory Visit: Payer: Self-pay | Admitting: Internal Medicine

## 2023-12-01 ENCOUNTER — Ambulatory Visit (HOSPITAL_BASED_OUTPATIENT_CLINIC_OR_DEPARTMENT_OTHER): Admitting: Obstetrics & Gynecology

## 2023-12-06 NOTE — Progress Notes (Addendum)
 Patient Care Team: Perri Ronal PARAS, MD as PCP - General (Internal Medicine) Pleasant, Cathlean DEL, RN as [facility] Care Management  Visit Date: 12/08/23  Subjective:    Patient ID: Donna Timmie Leiter, MD , Female   DOB: [birth year], 68 y.o.    MRN: [id number]   68 y.o. Female presents today for elevated blood pressure. Hypertension. Patient has a past medical history of Hypothyroidism, Anxiety/depression.  History of Anxiety/Depression treated with Xanax  0.50 mg as needed up to 3 times daily and Cymbalta  60 mg daily. She has been experiencing some situational stress. Her daughter and son in law are having marital issues.    History of Hypertension treated with Losartan  50 mg daily. Blood pressure today is normal at 110/80. She has noticed that her blood pressure has been elevated.    She has been dealing with toe nail fungus for some time. Denies any pain.      Past Medical History:  Diagnosis Date   Anxiety    Asthma    Dyslipidemia    GERD (gastroesophageal reflux disease)    HTN (hypertension)    Hypothyroidism      Family History  Problem Relation Age of Onset   Coronary artery disease Father    COPD Father    Cancer Father        Bladder   Hyperlipidemia Mother    Hypertension Mother     Social History   Social History Narrative   MarriedPhysician    Social history: She is a retired stage manager. Worked for [employer] of [place] prior to her retirement. Spends time with new grandchild, all of her, in [place] area. She is a native of [place]. She attended [employer]. She is married to a retired teacher, early years/pre. Parents immigrated from [place]. She is an only child. Non-smoker. Social alcohol consumption. 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 Mother resides in [place].       Family history: Father died of complications of congestive heart failure, had history of chronic kidney  disease and history of bladder cancer. Mother with history of hypertension and hyperlipidemia doing well in her 50s residing alone near her daughter.          Review of Systems  All other systems reviewed and are negative.       Objective:   Vitals: BP 110/80   Pulse 92   Ht 5' 1.5 (1.562 m)   LMP 06/01/2013 Comment: spotting  SpO2 97%   BMI 34.02 kg/m    Physical Exam Vitals and nursing note reviewed.     Neck is supple. No thyromegaly. Chest is clear . Cor: RRR without ectopy. No pitting LE edema.  Results:     Labs:       Component Value Date/Time   NA 142 02/15/2023 0903   NA 140 11/25/2017 0943   K 4.7 02/15/2023 0903   CL 103 02/15/2023 0903   CO2 30 02/15/2023 0903   GLUCOSE 96 02/15/2023 0903   BUN 21 02/15/2023 0903   BUN 15 11/25/2017 0943   CREATININE 0.62 02/15/2023 0903   CALCIUM 9.9 02/15/2023 0903   PROT 6.7 08/23/2023 0920   PROT 7.4 11/25/2017 0943   ALBUMIN 4.6 11/25/2017 0943   AST 16 08/23/2023 0920   ALT 11 08/23/2023 0920   ALKPHOS 85 11/25/2017 0943   BILITOT 0.4 08/23/2023 0920   BILITOT 0.6 11/25/2017 0943   GFRNONAA 77 01/04/2020  9083   GFRAA 89 01/04/2020 0916     Lab Results  Component Value Date   WBC 6.5 02/15/2023   HGB 14.0 02/15/2023   HCT 42.9 02/15/2023   MCV 96.8 02/15/2023   PLT 258 02/15/2023    Lab Results  Component Value Date   CHOL 132 08/23/2023   HDL 70 08/23/2023   LDLCALC 49 08/23/2023   TRIG 54 08/23/2023   CHOLHDL 1.9 08/23/2023    Lab Results  Component Value Date   HGBA1C 5.6 02/15/2023     Lab Results  Component Value Date   TSH 1.99 08/23/2023        Assessment & Plan:   Meds ordered this encounter  Medications   DULoxetine  (CYMBALTA ) 60 MG capsule    Sig: Take 1 capsule (60 mg total) by mouth daily.    Dispense:  90 capsule    Refill:  3   terbinafine (LAMISIL) 250 MG tablet    Sig: Take 1 tablet (250 mg total) by mouth daily.    Dispense:  30 tablet    Refill:  0    hydrochlorothiazide (HYDRODIURIL) 25 MG tablet    Sig: Take 1 tablet (25 mg total) by mouth daily.    Dispense:  90 tablet    Refill:  3    Anxiety/Depression: treated with Xanax  0.50 mg as needed up to 3 times daily and Cymbalta  60 mg. Situational stress discussed.  Cymbalta  60 mg daily refilled.  Continue Xanax  0.5 mg up to 3 times daily  as needed.   Hypertension: treated with Losartan  100 mg daily. Blood pressure  reading today is normal at 110/80. She has noticed that her blood pressure has been elevated at times and is  likely due to situational stress.  HCTZ 25 mg  prescribed. Patient can monitor BP and decide if hydrochlorothiazide needed on a daily basis.  Onychomycosis: She has been dealing with toe nail fungus for some time. Does not have any painful toe nails.   Lamisil 250 mg daily prescribed for 30 days.  CPE scheduled for February 2026.  I,Makayla C Reid,acting as a scribe for Ronal JINNY Hailstone, MD.,have documented all relevant documentation on the behalf of Ronal JINNY Hailstone, MD,as directed by  Ronal JINNY Hailstone, MD while in the presence of Ronal JINNY Hailstone, MD.  I, Ronal JINNY Hailstone, MD, have reviewed all documentation for this visit. The documentation on 12/08/2023 for the exam, diagnosis, procedures, and orders are all accurate and complete.

## 2023-12-08 ENCOUNTER — Ambulatory Visit (INDEPENDENT_AMBULATORY_CARE_PROVIDER_SITE_OTHER): Payer: Self-pay | Admitting: Internal Medicine

## 2023-12-08 ENCOUNTER — Encounter: Payer: Self-pay | Admitting: Internal Medicine

## 2023-12-08 VITALS — BP 110/80 | HR 92 | Ht 61.5 in | Wt 183.0 lb

## 2023-12-08 DIAGNOSIS — F418 Other specified anxiety disorders: Secondary | ICD-10-CM | POA: Diagnosis not present

## 2023-12-08 DIAGNOSIS — B351 Tinea unguium: Secondary | ICD-10-CM

## 2023-12-08 DIAGNOSIS — F32A Depression, unspecified: Secondary | ICD-10-CM

## 2023-12-08 DIAGNOSIS — I10 Essential (primary) hypertension: Secondary | ICD-10-CM

## 2023-12-08 DIAGNOSIS — F439 Reaction to severe stress, unspecified: Secondary | ICD-10-CM

## 2023-12-08 MED ORDER — TERBINAFINE HCL 250 MG PO TABS: 250.0000 mg | ORAL_TABLET | Freq: Every day | ORAL | 0 refills | Status: DC

## 2023-12-08 MED ORDER — HYDROCHLOROTHIAZIDE 25 MG PO TABS
25.0000 mg | ORAL_TABLET | Freq: Every day | ORAL | 3 refills | Status: AC
Start: 2023-12-08 — End: ?

## 2023-12-08 MED ORDER — DULOXETINE HCL 60 MG PO CPEP: 60.0000 mg | ORAL_CAPSULE | Freq: Every day | ORAL | 3 refills | Status: AC

## 2023-12-15 ENCOUNTER — Encounter: Payer: Self-pay | Admitting: Internal Medicine

## 2023-12-15 NOTE — Patient Instructions (Addendum)
 Hydrochlorothiazide added to Losartan  100 mg daily. May take Lamisil 250 mg daily for 30 days for onychomycosis. CPE scheduled for February. Continue Xanax  and Cymbalta .

## 2024-02-20 NOTE — Progress Notes (Incomplete)
 "  Annual Wellness Visit   Patient Care Team: Baxley, Donna PARAS, MD as PCP - General (Internal Medicine) Pleasant, Cathlean DEL, RN as [facility] Care Management  Visit Date: 02/20/24   No chief complaint on file.  Subjective:  Patient: Donna Mazzocchi Douthitt, MD, Female DOB: 1955-09-17, 69 y.o. MRN: [id number] There were no vitals filed for this visit. Donna Mazzocchi Pascual, MD is a 69 y.o. Female who presents today for her Annual Wellness Visit. Patient has DYSLIPIDEMIA; ESSENTIAL HYPERTENSION, BENIGN; Simple endometrial hyperplasia; Hypothyroidism; Asthma; GE reflux; Allergic rhinitis; Rosacea; Incomplete uterine prolapse; History of stress incontinence; Obesity; and Constipation on their problem list.  History of Anxiety/Depression treated with 0.50 mg Xanax  as needed up to 3 times daily for anxiety (hasn't been refilled since June 2021) and 10 mg Celexa  daily. Reports some stress about her mother, who is 19 y.o, and constantly anxious. Has talked to mother about moving to a facility where she can be surrounded by peers, but she had declined unless necessary.    History of Asthma treated with 250-50 mcg Advair  BID morning and night.   History of Hypothyroidism treated with 50 mcg Levothyroxine  daily. TSH ***   History of Hypertension treated with 50 mg Losartan  daily. Blood pressure is *** today at ***.      History of GE Reflux treated with 4 mg Zofran  every 8 hours as needed for nausea/vomiting.    History of Dyslipidemia treated with 40 mg Pravastatin  daily at bedtime.  Lipid Panel ***   Labs ***/***/*** {Labs (Optional):31667}   04/04/2023 Mammogram No mammographic evidence of malignancy. Repeat in one year.    04/04/2023 Bone density Right femur neck BMD 0.712 T score -1.20.   Health Maintenance  Topic Date Due   Influenza Vaccine  09/02/2023   COVID-19 Vaccine (7 - 2025-26 season) 10/03/2023   Medicare Annual Wellness (AWV)  02/17/2024   Mammogram   04/03/2025   Colonoscopy  08/20/2026   DTaP/Tdap/Td (3 - Td or Tdap) 09/17/2027   Pneumococcal Vaccine: 50+ Years  Completed   Bone Density Scan  Completed   Hepatitis C Screening  Completed   Zoster Vaccines- Shingrix  Completed   Meningococcal B Vaccine  Aged Out    {Man or Woman:32389}  Vaccine Counseling: Due for {Vaccines:32291::Influenza}; UTD on {Vaccines:32291::Influenza}  ROS Objective:  Vitals: body mass index is unknown because there is no height or weight on file.There were no vitals filed for this visit. Physical Exam  Current Outpatient Medications  Medication Instructions   ALPRAZolam  (XANAX ) 0.5 mg, Oral, 3 times daily PRN   cholecalciferol (VITAMIN D ) 1,000 Units, Daily   docusate sodium (COLACE) 100 mg, 2 times daily   DULoxetine  (CYMBALTA ) 60 mg, Oral, Daily   famotidine (PEPCID) 20 mg, 2 times daily   fexofenadine (ALLEGRA) 180 mg, As needed   fluticasone -salmeterol (ADVAIR ) 250-50 MCG/ACT AEPB INHALE 1 DOSE BY MOUTH IN THE MORNING AND AT BEDTIME   gabapentin (NEURONTIN) 300 mg, Daily at bedtime   hydrochlorothiazide  (HYDRODIURIL ) 25 mg, Oral, Daily   levothyroxine  (SYNTHROID ) 50 mcg, Oral, Daily   losartan  (COZAAR ) 100 mg, Oral, Daily   Omega-3 1000 MG CAPS No dose, route, or frequency recorded.   ondansetron  (ZOFRAN ) 4 mg, Oral, Every 8 hours PRN   pravastatin  (PRAVACHOL ) 40 mg, Oral, Daily at bedtime   terbinafine  (LAMISIL ) 250 mg, Oral, Daily   triamcinolone  cream (KENALOG ) 0.1 % 1 Application, Topical, 3 times daily   Past Medical History:  Diagnosis Date  Anxiety    Asthma    Dyslipidemia    GERD (gastroesophageal reflux disease)    HTN (hypertension)    Hypothyroidism    Medical/Surgical History Narrative:  Allergic/Intolerant to: Allergies[1] *** - ***  *** - ***  *** - ***  *** - ***  *** - ***  *** - ***  *** - ***  *** - *** Other - Hx of: *** ; Surghx of: *** Past Surgical History:  Procedure Laterality Date    BREAST SURGERY Left 2000   Bening   CARDIAC CATHETERIZATION     CHOLECYSTECTOMY     DIAGNOSTIC LAPAROSCOPY     DILATION AND CURETTAGE OF UTERUS  04/12/2011   Procedure: DILATATION AND CURETTAGE;  Surgeon: Donna GORMAN Pinal, MD;  Location: [facility];  Service: Gynecology;  Laterality: N/A;   INTRAUTERINE DEVICE INSERTION  04/19/11   Mirena   Family History  Problem Relation Age of Onset   Coronary artery disease Father    COPD Father    Cancer Father        Bladder   Hyperlipidemia Mother    Hypertension Mother    Family History Narrative: {ELFamHX:31110} Social History   Social History Narrative   MarriedPhysician    Social history: She is a retired stage manager. Worked for [employer] of [place] prior to her retirement. Spends time with new grandchild, all of her, in [place] area. She is a native of [place]. She attended [employer]. She is married to a retired teacher, early years/pre. Parents immigrated from [place]. She is an only child. Non-smoker. Social alcohol consumption. 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 Mother resides in [place].       Family history: Father died of complications of congestive heart failure, had history of chronic kidney disease and history of bladder cancer. Mother with history of hypertension and hyperlipidemia doing well in her 45s residing alone near her daughter.       Most Recent Health Risks Assessment:   Most Recent Social Determinants of Health (Including Hx of Tobacco, Alcohol, and Drug Use) SDOH Screenings   Food Insecurity: No Food Insecurity (02/08/2023)  Housing: Low Risk (02/08/2023)  Transportation Needs: No Transportation Needs (02/08/2023)  Utilities: Not At Risk (02/08/2023)  Alcohol Screen: Low Risk (02/17/2023)  Depression (PHQ2-9): Low Risk (12/08/2023)  Financial Resource Strain: Low Risk (02/08/2023)  Physical Activity: Sufficiently Active (02/08/2023)  Social Connections: Moderately  Integrated (02/08/2023)  Stress: Stress Concern Present (02/08/2023)  Tobacco Use: Low Risk (12/15/2023)  Health Literacy: Adequate Health Literacy (02/08/2023)   Social History[2] Most Recent Functional Status Assessment:     No data to display         Most Recent Fall Risk Assessment:    02/17/2023    9:49 AM  Fall Risk   Falls in the past year? 0  Number falls in past yr: 0  Injury with Fall? 0   Risk for fall due to : No Fall Risks  Follow up Falls prevention discussed;Education provided;Falls evaluation completed     Data saved with a previous flowsheet row definition   Most Recent Anxiety/Depression Screenings:    12/08/2023   12:55 PM 09/05/2023    3:21 PM  PHQ 2/9 Scores  PHQ - 2 Score 1 1  PHQ- 9 Score 4 3      Data saved with a previous flowsheet row definition      12/08/2023   12:56 PM  GAD 7 :  Generalized Anxiety Score  Nervous, Anxious, on Edge 1  Control/stop worrying 1  Worry too much - different things 0  Trouble relaxing 0  Restless 0  Easily annoyed or irritable 0  Afraid - awful might happen 1  Total GAD 7 Score 3  Anxiety Difficulty Not difficult at all   Most Recent Cognitive Screening:    02/17/2023    9:57 AM  6CIT Screen  What Year? 0 points  What month? 0 points  What time? 0 points  Count back from 20 0 points  Months in reverse 0 points  Repeat phrase 0 points  Total Score 0 points   Most Recent Vision/Hearing Screenings:No results found. Results:  Studies Obtained And Personally Reviewed By Me: Diabetic Foot Exam - Simple   No data filed     {Imaging, colonoscopy, mammogram, bone density scan, echocardiogram, heart cath, stress test, CT calcium score, etc.:32292}  Labs:  CBC w/ Differential Lab Results  Component Value Date   WBC 6.5 02/15/2023   RBC 4.43 02/15/2023   HGB 14.0 02/15/2023   HCT 42.9 02/15/2023   PLT 258 02/15/2023   MCV 96.8 02/15/2023   MCH 31.6 02/15/2023   MCHC 32.6 02/15/2023   RDW 12.7  02/15/2023   MPV 10.9 02/15/2023   LYMPHSABS 2,318 02/08/2022   MONOABS 0.7 04/25/2015   BASOSABS 59 02/15/2023    Comprehensive Metabolic Panel Lab Results  Component Value Date   NA 142 02/15/2023   K 4.7 02/15/2023   CL 103 02/15/2023   CO2 30 02/15/2023   GLUCOSE 96 02/15/2023   BUN 21 02/15/2023   CREATININE 0.62 02/15/2023   CALCIUM 9.9 02/15/2023   PROT 6.7 08/23/2023   ALBUMIN 4.6 11/25/2017   AST 16 08/23/2023   ALT 11 08/23/2023   ALKPHOS 85 11/25/2017   BILITOT 0.4 08/23/2023   EGFR 98 02/15/2023   GFRNONAA 77 01/04/2020   Lipid Panel  Lab Results  Component Value Date   CHOL 132 08/23/2023   HDL 70 08/23/2023   LDLCALC 49 08/23/2023   TRIG 54 08/23/2023   A1c Lab Results  Component Value Date   HGBA1C 5.6 02/15/2023    TSH Lab Results  Component Value Date   TSH 1.99 08/23/2023   PSA{PSA (Optional):32132} No results found for any visits on 03/05/24. Assessment & Plan:  No orders of the defined types were placed in this encounter.  No orders of the defined types were placed in this encounter.  Other Labs Reviewed today:    No follow-ups on file.   Annual Wellness Visit done today including the all of the following: Reviewed patient's Family Medical History Reviewed patient's SDOH and reviewed tobacco, alcohol, and drug use.  Reviewed and updated list of patient's medical providers Assessment of cognitive impairment was done Assessed patient's functional ability Established a written schedule for health screening services Health Risk Assessent Completed and Reviewed  Discussed health benefits of physical activity, and encouraged her to engage in regular exercise appropriate for her age and condition.    I,Makayla C Reid,acting as a scribe for Donna JINNY Hailstone, MD.,have documented all relevant documentation on the behalf of Donna JINNY Hailstone, MD,as directed by  Donna JINNY Hailstone, MD while in the presence of Donna JINNY Hailstone, MD.  I, Donna JINNY Hailstone, MD,  have reviewed all documentation for and agree with the above Annual Wellness Visit documentation.  Donna JINNY Hailstone, MD Internal Medicine 03/05/2024    [1] No Known Allergies [2]  Social  History Tobacco Use   Smoking status: Never   Smokeless tobacco: Never   Tobacco comments:    no tobacco abuse   Vaping Use   Vaping status: Never Used  Substance Use Topics   Alcohol use: No   Drug use: No   "

## 2024-02-23 ENCOUNTER — Ambulatory Visit (HOSPITAL_BASED_OUTPATIENT_CLINIC_OR_DEPARTMENT_OTHER): Admitting: Obstetrics & Gynecology

## 2024-03-01 ENCOUNTER — Other Ambulatory Visit: Payer: Self-pay

## 2024-03-01 DIAGNOSIS — E039 Hypothyroidism, unspecified: Secondary | ICD-10-CM

## 2024-03-01 DIAGNOSIS — I10 Essential (primary) hypertension: Secondary | ICD-10-CM

## 2024-03-01 DIAGNOSIS — E7800 Pure hypercholesterolemia, unspecified: Secondary | ICD-10-CM

## 2024-03-01 DIAGNOSIS — F32A Depression, unspecified: Secondary | ICD-10-CM

## 2024-03-01 DIAGNOSIS — R7302 Impaired glucose tolerance (oral): Secondary | ICD-10-CM

## 2024-03-01 DIAGNOSIS — Z0000 Encounter for general adult medical examination without abnormal findings: Secondary | ICD-10-CM

## 2024-03-01 NOTE — Progress Notes (Signed)
 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 [phone] Caucasian female being seen today for breast and pelvic exam  Patient had blood drawn yesterday and would like to discuss results. No other concerns or complaints today. Patient does report that she has been having issues with her back. Right leg is almost always numb.  Seeing Dr. Bonner.  Was born as full breech extraction and had leg fracture.  She has a small difference in her leg length.  They discussed surgery and she would need four levels done.  Would likely need to have staged surgery.    She is helping with care of her grandkids.    Denies vaginal bleeding.  H/o endometrial hyperplasia in 2013.  Was treated with Mirena IUD.  H/o prolapse and some urinary issues.    Patient's last menstrual period was 06/01/2013.   Last pap 07/17/2021. Results were: NILM w/ HRHPV not done. H/O abnormal pap: no Last mammogram: 04/04/2023. Results were: normal. Family h/o breast cancer: no Last colonoscopy: 08/19/2021. Results were: normal. Family h/o colorectal cancer: no.  Follow up 5 years.   DEXA:  04/05/2023.  T score -1.2     03/02/2024    8:27 AM 12/08/2023   12:55 PM 09/05/2023    3:21 PM 02/17/2023    9:57 AM 02/11/2022   10:15 AM  Depression screen PHQ 2/9  Decreased Interest 0 0 0 0 0  Down, Depressed, Hopeless 0 1 1 0 0  PHQ - 2 Score 0 1 1 0 0  Altered sleeping  1 1    Tired, decreased energy  1 1    Change in appetite  1 0    Feeling bad or failure about yourself   0 0    Trouble concentrating  0 0    Moving slowly or fidgety/restless  0 0    Suicidal thoughts  0 0    PHQ-9 Score  4 3     Difficult doing work/chores  Not difficult at all Not difficult at all       Data saved with a previous flowsheet row definition        12/08/2023   12:56 PM  GAD 7  : Generalized Anxiety Score  Nervous, Anxious, on Edge 1   Control/stop worrying 1   Worry too much - different things 0   Trouble relaxing 0   Restless 0   Easily annoyed or irritable 0   Afraid - awful might happen 1   Total GAD 7 Score 3  Anxiety Difficulty Not difficult at all     Data saved with a previous flowsheet row definition     Review of Systems:   Pertinent items are noted in HPI Denies any new bowel or bladder changes.  Does have constipation.   Pertinent History Reviewed:  Reviewed past medical,surgical, social and family history.  Reviewed problem list, medications and allergies. Physical Assessment:   Vitals:   03/02/24 0812  BP: 121/84  Pulse: 98  SpO2: 100%  Weight: 181 lb (82.1 kg)  Body mass index is 33.65 kg/m.        Physical Examination:   General appearance - well appearing, and in no distress  Mental status - alert, oriented to person, place, and time  Psych:  She has a normal mood and  affect  Skin - warm and dry, normal color, no suspicious lesions noted  Chest - effort normal, all lung fields clear to auscultation bilaterally  Heart - normal rate and regular rhythm  Neck:  midline trachea, no thyromegaly or nodules  Breasts - breasts appear normal, no suspicious masses, no skin or nipple changes or  axillary nodes  Abdomen - soft, nontender, nondistended, no masses or organomegaly  Pelvic - VULVA: normal appearing vulva with no masses, tenderness or lesions   VAGINA: normal appearing vagina with normal color and discharge, no lesions   CERVIX: normal appearing cervix without discharge or lesions, no CMT  Thin prep pap is updated today  UTERUS: uterus is felt to be normal size, shape, consistency and nontender   ADNEXA: No adnexal masses or tenderness noted.  Rectal - normal rectal, good sphincter tone, no masses felt  Extremities:  No swelling or varicosities noted  Chaperone present for exam  Results for orders placed or performed in  visit on 03/01/24 (from the past 24 hours)  CBC with Differential/Platelet   Collection Time: 03/01/24 12:15 PM  Result Value Ref Range   WBC 8.8 3.8 - 10.8 Thousand/uL   RBC 4.36 3.80 - 5.10 Million/uL   Hemoglobin 14.1 11.7 - 15.5 g/dL   HCT 57.7 64.0 - 53.9 %   MCV 96.8 81.4 - 101.7 fL   MCH 32.3 27.0 - 33.0 pg   MCHC 33.4 31.6 - 35.4 g/dL   RDW 87.6 88.9 - 84.9 %   Platelets 283 140 - 400 Thousand/uL   MPV 10.8 7.5 - 12.5 fL   Neutro Abs 4,726 1,500 - 7,800 cells/uL   Absolute Lymphocytes 3,080 850 - 3,900 cells/uL   Absolute Monocytes 783 200 - 950 cells/uL   Eosinophils Absolute 123 15 - 500 cells/uL   Basophils Absolute 88 0 - 200 cells/uL   Neutrophils Relative % 53.7 %   Total Lymphocyte 35.0 %   Monocytes Relative 8.9 %   Eosinophils Relative 1.4 %   Basophils Relative 1.0 %  Comprehensive metabolic panel with GFR   Collection Time: 03/01/24 12:15 PM  Result Value Ref Range   Glucose, Bld 100 (H) 65 - 99 mg/dL   BUN 23 7 - 25 mg/dL   Creat 9.10 9.49 - 8.94 mg/dL   eGFR 71 > OR = 60 fO/fpw/8.26f7   BUN/Creatinine Ratio SEE NOTE: 6 - 22 (calc)   Sodium 139 135 - 146 mmol/L   Potassium 4.3 3.5 - 5.3 mmol/L   Chloride 99 98 - 110 mmol/L   CO2 31 20 - 32 mmol/L   Calcium 10.0 8.6 - 10.4 mg/dL   Total Protein 7.0 6.1 - 8.1 g/dL   Albumin 4.4 3.6 - 5.1 g/dL   Globulin 2.6 1.9 - 3.7 g/dL (calc)   AG Ratio 1.7 1.0 - 2.5 (calc)   Total Bilirubin 0.7 0.2 - 1.2 mg/dL   Alkaline phosphatase (APISO) 82 37 - 153 U/L   AST 14 10 - 35 U/L   ALT 11 6 - 29 U/L  Hemoglobin A1c   Collection Time: 03/01/24 12:15 PM  Result Value Ref Range   Hgb A1c MFr Bld 5.5 <5.7 %   Mean Plasma Glucose 111 mg/dL   eAG (mmol/L) 6.2 mmol/L  Lipid panel   Collection Time: 03/01/24 12:15 PM  Result Value Ref Range   Cholesterol 139 <200 mg/dL   HDL 73 > OR = 50 mg/dL   Triglycerides 78 <849 mg/dL   LDL Cholesterol (  Calc) 50 mg/dL (calc)   Total CHOL/HDL Ratio 1.9 <5.0 (calc)   Non-HDL  Cholesterol (Calc) 66 <130 mg/dL (calc)  TSH   Collection Time: 03/01/24 12:15 PM  Result Value Ref Range   TSH 4.75 (H) 0.40 - 4.50 mIU/L    Assessment & Plan:  1. Encntr for gyn exam (general) (routine) w abnormal findings (Primary) - Pap smear updated - Mammogram 04/04/2023 - Colonoscopy 08/19/2021 - Bone mineral density 04/04/2023 - lab work done with PCP, Dr. Perri - vaccines reviewed/updated  2. Abnormal TSH - T4, free  3. Incomplete uterine prolapse - stable changes  4. Simple endometrial hyperplasia - in 2013.  Used Mirena IUD.  No vaginal bleeding since then.  5. Cervical cancer screening - Cytology - PAP( [facility]) - PR OBTAINING SCREEN PAP SMEAR  6. Osteopenia of both forearms - reviewed BMD.  Taking 1000 international units Vit D.    Orders Placed This Encounter  Procedures   T4, free   PR OBTAINING SCREEN PAP SMEAR    Meds: No orders of the defined types were placed in this encounter.   Follow-up: No follow-ups on file.  Ronal GORMAN Pinal, MD 03/02/2024 8:55 AM "

## 2024-03-02 ENCOUNTER — Encounter (HOSPITAL_BASED_OUTPATIENT_CLINIC_OR_DEPARTMENT_OTHER): Payer: Self-pay | Admitting: Obstetrics & Gynecology

## 2024-03-02 ENCOUNTER — Telehealth: Payer: Self-pay | Admitting: Internal Medicine

## 2024-03-02 ENCOUNTER — Ambulatory Visit: Payer: Self-pay | Admitting: Internal Medicine

## 2024-03-02 ENCOUNTER — Other Ambulatory Visit (HOSPITAL_COMMUNITY)
Admission: RE | Admit: 2024-03-02 | Discharge: 2024-03-02 | Disposition: A | Source: Ambulatory Visit | Attending: Obstetrics & Gynecology | Admitting: Obstetrics & Gynecology

## 2024-03-02 ENCOUNTER — Ambulatory Visit (INDEPENDENT_AMBULATORY_CARE_PROVIDER_SITE_OTHER): Admitting: Obstetrics & Gynecology

## 2024-03-02 VITALS — BP 121/84 | HR 98 | Wt 181.0 lb

## 2024-03-02 DIAGNOSIS — M85832 Other specified disorders of bone density and structure, left forearm: Secondary | ICD-10-CM | POA: Diagnosis not present

## 2024-03-02 DIAGNOSIS — Z01411 Encounter for gynecological examination (general) (routine) with abnormal findings: Secondary | ICD-10-CM

## 2024-03-02 DIAGNOSIS — Z1331 Encounter for screening for depression: Secondary | ICD-10-CM

## 2024-03-02 DIAGNOSIS — Z124 Encounter for screening for malignant neoplasm of cervix: Secondary | ICD-10-CM

## 2024-03-02 DIAGNOSIS — R7989 Other specified abnormal findings of blood chemistry: Secondary | ICD-10-CM

## 2024-03-02 DIAGNOSIS — N812 Incomplete uterovaginal prolapse: Secondary | ICD-10-CM

## 2024-03-02 DIAGNOSIS — N8501 Benign endometrial hyperplasia: Secondary | ICD-10-CM

## 2024-03-02 DIAGNOSIS — E039 Hypothyroidism, unspecified: Secondary | ICD-10-CM

## 2024-03-02 DIAGNOSIS — M85831 Other specified disorders of bone density and structure, right forearm: Secondary | ICD-10-CM | POA: Diagnosis not present

## 2024-03-02 LAB — COMPREHENSIVE METABOLIC PANEL WITH GFR
AG Ratio: 1.7 (calc) (ref 1.0–2.5)
ALT: 11 U/L (ref 6–29)
AST: 14 U/L (ref 10–35)
Albumin: 4.4 g/dL (ref 3.6–5.1)
Alkaline phosphatase (APISO): 82 U/L (ref 37–153)
BUN: 23 mg/dL (ref 7–25)
CO2: 31 mmol/L (ref 20–32)
Calcium: 10 mg/dL (ref 8.6–10.4)
Chloride: 99 mmol/L (ref 98–110)
Creat: 0.89 mg/dL (ref 0.50–1.05)
Globulin: 2.6 g/dL (ref 1.9–3.7)
Glucose, Bld: 100 mg/dL — ABNORMAL HIGH (ref 65–99)
Potassium: 4.3 mmol/L (ref 3.5–5.3)
Sodium: 139 mmol/L (ref 135–146)
Total Bilirubin: 0.7 mg/dL (ref 0.2–1.2)
Total Protein: 7 g/dL (ref 6.1–8.1)
eGFR: 71 mL/min/{1.73_m2}

## 2024-03-02 LAB — LIPID PANEL
Cholesterol: 139 mg/dL
HDL: 73 mg/dL
LDL Cholesterol (Calc): 50 mg/dL
Non-HDL Cholesterol (Calc): 66 mg/dL
Total CHOL/HDL Ratio: 1.9 (calc)
Triglycerides: 78 mg/dL

## 2024-03-02 LAB — CBC WITH DIFFERENTIAL/PLATELET
Absolute Lymphocytes: 3080 {cells}/uL (ref 850–3900)
Absolute Monocytes: 783 {cells}/uL (ref 200–950)
Basophils Absolute: 88 {cells}/uL (ref 0–200)
Basophils Relative: 1 %
Eosinophils Absolute: 123 {cells}/uL (ref 15–500)
Eosinophils Relative: 1.4 %
HCT: 42.2 % (ref 35.9–46.0)
Hemoglobin: 14.1 g/dL (ref 11.7–15.5)
MCH: 32.3 pg (ref 27.0–33.0)
MCHC: 33.4 g/dL (ref 31.6–35.4)
MCV: 96.8 fL (ref 81.4–101.7)
MPV: 10.8 fL (ref 7.5–12.5)
Monocytes Relative: 8.9 %
Neutro Abs: 4726 {cells}/uL (ref 1500–7800)
Neutrophils Relative %: 53.7 %
Platelets: 283 10*3/uL (ref 140–400)
RBC: 4.36 Million/uL (ref 3.80–5.10)
RDW: 12.3 % (ref 11.0–15.0)
Total Lymphocyte: 35 %
WBC: 8.8 10*3/uL (ref 3.8–10.8)

## 2024-03-02 LAB — HEMOGLOBIN A1C
Hgb A1c MFr Bld: 5.5 %
Mean Plasma Glucose: 111 mg/dL
eAG (mmol/L): 6.2 mmol/L

## 2024-03-02 LAB — TSH: TSH: 4.75 m[IU]/L — ABNORMAL HIGH (ref 0.40–4.50)

## 2024-03-02 MED ORDER — LEVOTHYROXINE SODIUM 75 MCG PO TABS: 75.0000 ug | ORAL_TABLET | Freq: Every day | ORAL | 0 refills | Status: AC

## 2024-03-02 NOTE — Telephone Encounter (Signed)
 done

## 2024-03-03 ENCOUNTER — Ambulatory Visit (HOSPITAL_BASED_OUTPATIENT_CLINIC_OR_DEPARTMENT_OTHER): Payer: Self-pay | Admitting: Obstetrics & Gynecology

## 2024-03-03 LAB — T4, FREE: Free T4: 1.51 ng/dL (ref 0.82–1.77)

## 2024-03-05 ENCOUNTER — Ambulatory Visit: Payer: Self-pay | Admitting: Internal Medicine

## 2024-03-06 LAB — CYTOLOGY - PAP: Diagnosis: NEGATIVE

## 2024-03-07 NOTE — Telephone Encounter (Signed)
 Done

## 2024-04-30 ENCOUNTER — Ambulatory Visit: Admitting: Internal Medicine
